# Patient Record
Sex: Female | Born: 1980 | Race: White | Hispanic: No | Marital: Married | Smoking: Never smoker
Health system: Southern US, Community
[De-identification: ages and names within clinical notes are randomized; demographics above are authoritative.]

## PROBLEM LIST (undated history)

## (undated) DIAGNOSIS — B019 Varicella without complication: Secondary | ICD-10-CM

## (undated) DIAGNOSIS — J189 Pneumonia, unspecified organism: Secondary | ICD-10-CM

## (undated) DIAGNOSIS — F909 Attention-deficit hyperactivity disorder, unspecified type: Secondary | ICD-10-CM

## (undated) DIAGNOSIS — U071 COVID-19: Secondary | ICD-10-CM

## (undated) DIAGNOSIS — F419 Anxiety disorder, unspecified: Secondary | ICD-10-CM

## (undated) HISTORY — PX: WISDOM TOOTH EXTRACTION: SHX21

## (undated) HISTORY — DX: Anxiety disorder, unspecified: F41.9

## (undated) HISTORY — DX: Varicella without complication: B01.9

---

## 2014-05-31 ENCOUNTER — Ambulatory Visit: Payer: BC Managed Care – PPO | Attending: Obstetrics and Gynecology | Admitting: Physical Therapy

## 2014-05-31 DIAGNOSIS — IMO0002 Reserved for concepts with insufficient information to code with codable children: Secondary | ICD-10-CM | POA: Diagnosis not present

## 2014-05-31 DIAGNOSIS — N942 Vaginismus: Secondary | ICD-10-CM | POA: Diagnosis not present

## 2014-05-31 DIAGNOSIS — IMO0001 Reserved for inherently not codable concepts without codable children: Secondary | ICD-10-CM | POA: Insufficient documentation

## 2014-06-15 ENCOUNTER — Ambulatory Visit: Payer: BC Managed Care – PPO | Admitting: Physical Therapy

## 2014-06-20 ENCOUNTER — Ambulatory Visit: Payer: BC Managed Care – PPO | Attending: Obstetrics and Gynecology | Admitting: Physical Therapy

## 2014-06-20 DIAGNOSIS — N942 Vaginismus: Secondary | ICD-10-CM | POA: Diagnosis not present

## 2014-06-20 DIAGNOSIS — IMO0001 Reserved for inherently not codable concepts without codable children: Secondary | ICD-10-CM | POA: Diagnosis not present

## 2014-06-20 DIAGNOSIS — IMO0002 Reserved for concepts with insufficient information to code with codable children: Secondary | ICD-10-CM | POA: Insufficient documentation

## 2014-07-03 ENCOUNTER — Ambulatory Visit: Payer: BC Managed Care – PPO | Admitting: Physical Therapy

## 2014-07-03 DIAGNOSIS — IMO0001 Reserved for inherently not codable concepts without codable children: Secondary | ICD-10-CM | POA: Diagnosis not present

## 2014-09-17 ENCOUNTER — Other Ambulatory Visit: Payer: Self-pay | Admitting: Family Medicine

## 2014-09-17 ENCOUNTER — Ambulatory Visit
Admission: RE | Admit: 2014-09-17 | Discharge: 2014-09-17 | Disposition: A | Discharge status: Home / Self Care | Payer: BC Managed Care – PPO | Source: Ambulatory Visit | Attending: Family Medicine | Admitting: Family Medicine

## 2014-09-17 DIAGNOSIS — M545 Low back pain: Secondary | ICD-10-CM

## 2014-10-29 ENCOUNTER — Ambulatory Visit: Payer: BC Managed Care – PPO

## 2014-11-05 ENCOUNTER — Ambulatory Visit: Payer: BC Managed Care – PPO

## 2014-11-20 ENCOUNTER — Ambulatory Visit: Payer: BC Managed Care – PPO | Attending: Family Medicine

## 2014-11-20 DIAGNOSIS — M545 Low back pain: Secondary | ICD-10-CM | POA: Diagnosis not present

## 2014-11-20 DIAGNOSIS — R5381 Other malaise: Secondary | ICD-10-CM | POA: Diagnosis not present

## 2014-11-29 ENCOUNTER — Ambulatory Visit: Payer: BC Managed Care – PPO

## 2014-11-29 DIAGNOSIS — M545 Low back pain: Secondary | ICD-10-CM | POA: Diagnosis not present

## 2014-12-13 ENCOUNTER — Ambulatory Visit: Payer: BC Managed Care – PPO

## 2014-12-13 DIAGNOSIS — M545 Low back pain: Secondary | ICD-10-CM | POA: Diagnosis not present

## 2014-12-20 ENCOUNTER — Ambulatory Visit: Payer: BC Managed Care – PPO | Attending: Family Medicine

## 2014-12-20 DIAGNOSIS — R5381 Other malaise: Secondary | ICD-10-CM | POA: Insufficient documentation

## 2014-12-20 DIAGNOSIS — M545 Low back pain: Secondary | ICD-10-CM | POA: Diagnosis present

## 2014-12-27 ENCOUNTER — Ambulatory Visit: Payer: BC Managed Care – PPO

## 2014-12-27 DIAGNOSIS — M545 Low back pain: Secondary | ICD-10-CM | POA: Diagnosis not present

## 2015-07-05 ENCOUNTER — Encounter: Payer: Self-pay | Admitting: Primary Care

## 2015-07-05 ENCOUNTER — Ambulatory Visit (INDEPENDENT_AMBULATORY_CARE_PROVIDER_SITE_OTHER): Payer: BC Managed Care – PPO | Admitting: Primary Care

## 2015-07-05 VITALS — BP 110/68 | HR 98 | Temp 98.1°F | Ht 70.0 in | Wt 173.0 lb

## 2015-07-05 DIAGNOSIS — F411 Generalized anxiety disorder: Secondary | ICD-10-CM | POA: Diagnosis not present

## 2015-07-05 DIAGNOSIS — R21 Rash and other nonspecific skin eruption: Secondary | ICD-10-CM

## 2015-07-05 DIAGNOSIS — R519 Headache, unspecified: Secondary | ICD-10-CM | POA: Insufficient documentation

## 2015-07-05 DIAGNOSIS — R51 Headache: Secondary | ICD-10-CM

## 2015-07-05 DIAGNOSIS — F32A Depression, unspecified: Secondary | ICD-10-CM | POA: Insufficient documentation

## 2015-07-05 MED ORDER — DESONIDE 0.05 % EX OINT
1.0000 | TOPICAL_OINTMENT | Freq: Two times a day (BID) | CUTANEOUS | Status: DC
Start: 2015-07-05 — End: 2015-11-14

## 2015-07-05 NOTE — Progress Notes (Signed)
Subjective:    Patient ID: Kathy Sanders, female    DOB: 04-29-1981, 34 y.o.   MRN: [id number]  HPI  Ms. Kathy Sanders is a 34 year old female who presents today to establish care and discuss the problems mentioned below. Will review old records.  1) Rash: Present to chin for the past 2 months. It's been waxing and waning during the summer. Denies itching and drainage, reports tenderness to the bumps that will pop up. She's tried applying an OTC antifungal cream without relief. She uses face wash daily, no new changes to face wash, body wash or detergents.   2) Generalized Anxiety Disorder: Present for years and has been on medication for 15 years. She was just switched from Lexapro to Wellbutrin XL 150mg  and feels improved. She is managed by a therapist once every 6 months but would like to transition to our clinic for management. Denies symptoms of depression, SI/HI.  3) Frequent headaches: Present every day to every other day for the past year and will take Excedrin and Advil. Denies migraines. Headaches are tolerable at this point.  Review of Systems  Constitutional: Negative for unexpected weight change.  HENT: Negative for rhinorrhea.   Respiratory: Negative for cough and shortness of breath.   Cardiovascular: Negative for chest pain.  Gastrointestinal: Negative for diarrhea and constipation.  Genitourinary: Negative for difficulty urinating.       She does not have regular periods, is managed by GYN.  Musculoskeletal: Negative for myalgias and arthralgias.  Skin: Positive for rash.  Neurological: Positive for headaches. Negative for numbness.       Occasional dizziness with postural changes.  Psychiatric/Behavioral:       See HPI       Past Medical History  Diagnosis Date  . Chicken pox     Social History   Social History  . Marital Status: Married    Spouse Name: N/A  . Number of Children: N/A  . Years of Education: N/A   Occupational History  . Not on file.    Social History Main Topics  . Smoking status: Never Smoker   . Smokeless tobacco: Not on file  . Alcohol Use: 0.0 oz/week    0 Standard drinks or equivalent per week     Comment: rare  . Drug Use: Not on file  . Sexual Activity: Not on file   Other Topics Concern  . Not on file   Social History Narrative  . No narrative on file    History reviewed. No pertinent past surgical history.  Family History  Problem Relation Age of Onset  . Mental retardation Mother   . Alcohol abuse Maternal Uncle   . Cancer Maternal Uncle     prostate  . Cancer Maternal Grandmother     breast  . Asthma Paternal Grandmother     Allergies  Allergen Reactions  . Amoxicillin     No current outpatient prescriptions on file prior to visit.   No current facility-administered medications on file prior to visit.    BP 110/68 mmHg  Pulse 98  Temp(Src) 98.1 F (36.7 C) (Oral)  Ht 5\' 10"  (1.778 m)  Wt 173 lb (78.472 kg)  BMI 24.82 kg/m2  SpO2 99%    Objective:   Physical Exam  Constitutional: She is oriented to person, place, and time. She appears well-nourished.  Cardiovascular: Normal rate and regular rhythm.   Pulmonary/Chest: Effort normal and breath sounds normal.  Neurological: She is alert and oriented to person,  place, and time.  Skin: Skin is warm and dry. Rash noted.  Mild 1/2 cm rash noted to chin representing eczema.  Psychiatric: She has a normal mood and affect.          Assessment & Plan:  Rash:  Present to chin intermittently x 2 months. No improvement with antifungal cream and daily face washing. Rash is 1/2 cm and appears to be atopic dermatitis. Will treat with Desonide 0.05% ointment BID. Follow up PRN.

## 2015-07-05 NOTE — Assessment & Plan Note (Signed)
Long standing history, taking medication for 15 years. Changed from Lexapro to Wellbutrin 1 year ago and feeling well managed. Denies symptoms of depression, Si/HI. Continue Wellbutrin although I suspect this is the cause of her headaches. Will continue to monitor.

## 2015-07-05 NOTE — Assessment & Plan Note (Signed)
Present x 1 year since starting Wellbutrin XL for GAD. Headaches are tolerable as she will take advil or excedrin PRN. Denies migraines. Will continue to monitor.

## 2015-07-05 NOTE — Patient Instructions (Signed)
Try applying the ointment twice daily for 3-4 weeks.  Please schedule a physical with me in the next 3-6 months. You will also schedule a lab only appointment one week prior. We will discuss your lab results during your physical.  It was a pleasure to meet you today! Please don't hesitate to call me with any questions. Welcome to [facility]!

## 2015-07-05 NOTE — Progress Notes (Signed)
Pre visit review using our clinic review tool, if applicable. No additional management support is needed unless otherwise documented below in the visit note. 

## 2015-09-03 ENCOUNTER — Ambulatory Visit (INDEPENDENT_AMBULATORY_CARE_PROVIDER_SITE_OTHER): Payer: BC Managed Care – PPO | Admitting: Primary Care

## 2015-09-03 ENCOUNTER — Encounter: Payer: Self-pay | Admitting: Primary Care

## 2015-09-03 VITALS — BP 116/72 | HR 86 | Temp 98.1°F | Ht 68.0 in | Wt 173.8 lb

## 2015-09-03 DIAGNOSIS — R438 Other disturbances of smell and taste: Secondary | ICD-10-CM

## 2015-09-03 LAB — CBC WITH DIFFERENTIAL/PLATELET
BASOS PCT: 0.6 % (ref 0.0–3.0)
Basophils Absolute: 0 10*3/uL (ref 0.0–0.1)
EOS PCT: 1.2 % (ref 0.0–5.0)
Eosinophils Absolute: 0.1 10*3/uL (ref 0.0–0.7)
HEMATOCRIT: 41.2 % (ref 36.0–46.0)
HEMOGLOBIN: 13.7 g/dL (ref 12.0–15.0)
LYMPHS PCT: 55.4 % — AB (ref 12.0–46.0)
Lymphs Abs: 3.7 10*3/uL (ref 0.7–4.0)
MCHC: 33.2 g/dL (ref 30.0–36.0)
MCV: 90.2 fl (ref 78.0–100.0)
MONOS PCT: 8.1 % (ref 3.0–12.0)
Monocytes Absolute: 0.5 10*3/uL (ref 0.1–1.0)
Neutro Abs: 2.3 10*3/uL (ref 1.4–7.7)
Neutrophils Relative %: 34.7 % — ABNORMAL LOW (ref 43.0–77.0)
Platelets: 280 10*3/uL (ref 150.0–400.0)
RBC: 4.57 Mil/uL (ref 3.87–5.11)
RDW: 13.1 % (ref 11.5–15.5)
WBC: 6.6 10*3/uL (ref 4.0–10.5)

## 2015-09-03 LAB — COMPREHENSIVE METABOLIC PANEL
ALBUMIN: 3.9 g/dL (ref 3.5–5.2)
ALK PHOS: 46 U/L (ref 39–117)
ALT: 12 U/L (ref 0–35)
AST: 16 U/L (ref 0–37)
BUN: 13 mg/dL (ref 6–23)
CALCIUM: 9.4 mg/dL (ref 8.4–10.5)
CHLORIDE: 101 meq/L (ref 96–112)
CO2: 31 mEq/L (ref 19–32)
Creatinine, Ser: 0.79 mg/dL (ref 0.40–1.20)
GFR: 87.4 mL/min (ref >60.00)
Glucose, Bld: 92 mg/dL (ref 70–99)
POTASSIUM: 3.7 meq/L (ref 3.5–5.1)
Sodium: 136 mEq/L (ref 135–145)
Total Bilirubin: 0.2 mg/dL (ref 0.2–1.2)
Total Protein: 7.2 g/dL (ref 6.0–8.3)

## 2015-09-03 NOTE — Progress Notes (Signed)
Subjective:    Patient ID: Kathy Sanders, female    DOB: 1981-02-19, 34 y.o.   MRN: [id number]  HPI  Kathy Sanders is a 34 year old female who presents today with a chief complaint of metallic taste in mouth. She first noticed this 3-4 months ago and is located only to the left side of her mouth. The taste occurs intermittently and sporatically with and without eating and drinking. She has a filtered water container that she's replaced recently. She's recently been evaluated at her dentist's office and was told her symptoms were not related to a dental problem. She will experience intermittent numbness/tinlging to the left side of her cheek. Denies recently taking antibiotics, taking any new medications, changing foods, fevers, recent illness. She has not had her water tested. Her husband does not have these symptoms.  Review of Systems  Constitutional: Negative for fever.  HENT: Negative for ear pain and rhinorrhea.   Respiratory: Negative for cough and shortness of breath.   Cardiovascular: Negative for chest pain.       Past Medical History  Diagnosis Date  . Chicken pox     Social History   Social History  . Marital Status: Married    Spouse Name: N/A  . Number of Children: N/A  . Years of Education: N/A   Occupational History  . Not on file.   Social History Main Topics  . Smoking status: Never Smoker   . Smokeless tobacco: Not on file  . Alcohol Use: 0.0 oz/week    0 Standard drinks or equivalent per week     Comment: rare  . Drug Use: Not on file  . Sexual Activity: Not on file   Other Topics Concern  . Not on file   Social History Narrative    No past surgical history on file.  Family History  Problem Relation Age of Onset  . Mental retardation Mother   . Alcohol abuse Maternal Uncle   . Cancer Maternal Uncle     prostate  . Cancer Maternal Grandmother     breast  . Asthma Paternal Grandmother     Allergies  Allergen Reactions  . Amoxicillin       Current Outpatient Prescriptions on File Prior to Visit  Medication Sig Dispense Refill  . buPROPion (WELLBUTRIN XL) 150 MG 24 hr tablet Take 150 mg by mouth daily.     Marland Kitchen desonide (DESOWEN) 0.05 % ointment Apply 1 application topically 2 (two) times daily. 15 g 0  . VIORELE 0.15-0.02/0.01 MG (21/5) tablet Take 1 tablet by mouth daily.      No current facility-administered medications on file prior to visit.    BP 116/72 mmHg  Pulse 86  Temp(Src) 98.1 F (36.7 C) (Oral)  Ht 5\' 8"  (1.727 m)  Wt 173 lb 12.8 oz (78.835 kg)  BMI 26.43 kg/m2  SpO2 98%    Objective:   Physical Exam  Constitutional: She is oriented to person, place, and time. She appears well-nourished.  HENT:  No obvious ulcer, infection, or abnormality to oral cavity. Teeth intact.  Neck: Neck supple.  Cardiovascular: Normal rate and regular rhythm.   Pulmonary/Chest: Effort normal and breath sounds normal.  Neurological: She is alert and oriented to person, place, and time. No cranial nerve deficit.  Skin: Skin is warm and dry.          Assessment & Plan:  Metallic taste:  Present intermittently for the past 3-4 months. Not affected by eating and drinking.  No recent illness or change in meds. She drinks filtered water and has not had her water tested. Exam unremarkable, however with some decreased sensation to left side of cheek. Cranial nerves otherwise normal. Will obtain CMP and CBC to rule out metabolic dysfunction. She is to have her water tested and follow up PRN.

## 2015-09-03 NOTE — Patient Instructions (Signed)
Complete lab work prior to leaving today. I will notify you of your results.  Have your water tested to ensure there are not metals in your water.  It was a pleasure to see you today!

## 2015-09-05 ENCOUNTER — Other Ambulatory Visit: Payer: Self-pay | Admitting: Primary Care

## 2015-09-05 DIAGNOSIS — R202 Paresthesia of skin: Principal | ICD-10-CM

## 2015-09-05 DIAGNOSIS — R200 Anesthesia of skin: Secondary | ICD-10-CM

## 2015-09-27 ENCOUNTER — Ambulatory Visit: Payer: Self-pay | Admitting: Neurology

## 2015-10-31 ENCOUNTER — Other Ambulatory Visit: Payer: Self-pay | Admitting: Primary Care

## 2015-10-31 DIAGNOSIS — Z0000 Encounter for general adult medical examination without abnormal findings: Secondary | ICD-10-CM

## 2015-11-07 ENCOUNTER — Other Ambulatory Visit: Payer: BC Managed Care – PPO

## 2015-11-07 ENCOUNTER — Encounter: Payer: BC Managed Care – PPO | Admitting: Primary Care

## 2015-11-08 ENCOUNTER — Other Ambulatory Visit (INDEPENDENT_AMBULATORY_CARE_PROVIDER_SITE_OTHER): Payer: BC Managed Care – PPO

## 2015-11-08 DIAGNOSIS — Z0000 Encounter for general adult medical examination without abnormal findings: Secondary | ICD-10-CM | POA: Diagnosis not present

## 2015-11-08 LAB — BASIC METABOLIC PANEL
BUN: 10 mg/dL (ref 6–23)
CALCIUM: 9.4 mg/dL (ref 8.4–10.5)
CO2: 27 mEq/L (ref 19–32)
CREATININE: 0.86 mg/dL (ref 0.40–1.20)
Chloride: 104 mEq/L (ref 96–112)
GFR: 79.17 mL/min (ref >60.00)
Glucose, Bld: 90 mg/dL (ref 70–99)
Potassium: 4.8 mEq/L (ref 3.5–5.1)
Sodium: 138 mEq/L (ref 135–145)

## 2015-11-08 LAB — LIPID PANEL
CHOLESTEROL: 162 mg/dL (ref 0–200)
HDL: 69.6 mg/dL (ref >39.00)
LDL Cholesterol: 81 mg/dL (ref 0–99)
NonHDL: 92.18
TRIGLYCERIDES: 56 mg/dL (ref 0.0–149.0)
Total CHOL/HDL Ratio: 2
VLDL: 11.2 mg/dL (ref 0.0–40.0)

## 2015-11-08 LAB — VITAMIN D 25 HYDROXY (VIT D DEFICIENCY, FRACTURES): VITD: 50.31 ng/mL (ref 30.00–100.00)

## 2015-11-08 LAB — TSH: TSH: 2.12 u[IU]/mL (ref 0.35–4.50)

## 2015-11-14 ENCOUNTER — Ambulatory Visit (INDEPENDENT_AMBULATORY_CARE_PROVIDER_SITE_OTHER): Payer: BC Managed Care – PPO | Admitting: Primary Care

## 2015-11-14 ENCOUNTER — Encounter: Payer: Self-pay | Admitting: Primary Care

## 2015-11-14 VITALS — BP 118/78 | HR 90 | Temp 98.6°F | Ht 70.0 in | Wt 174.2 lb

## 2015-11-14 DIAGNOSIS — Z0000 Encounter for general adult medical examination without abnormal findings: Secondary | ICD-10-CM | POA: Diagnosis not present

## 2015-11-14 DIAGNOSIS — R21 Rash and other nonspecific skin eruption: Secondary | ICD-10-CM | POA: Diagnosis not present

## 2015-11-14 DIAGNOSIS — Z0001 Encounter for general adult medical examination with abnormal findings: Secondary | ICD-10-CM | POA: Insufficient documentation

## 2015-11-14 DIAGNOSIS — F411 Generalized anxiety disorder: Secondary | ICD-10-CM

## 2015-11-14 DIAGNOSIS — R438 Other disturbances of smell and taste: Secondary | ICD-10-CM

## 2015-11-14 MED ORDER — TRIAMCINOLONE ACETONIDE 0.1 % EX CREA: 1.0000 "application " | TOPICAL_CREAM | Freq: Two times a day (BID) | CUTANEOUS | Status: DC

## 2015-11-14 NOTE — Assessment & Plan Note (Signed)
Tdap and flu UTD. Pap UTD.  Labs and exam unremarkable. Discussed the importance of a healthy diet and regular exercise in order for weight loss and to reduce risk of other medical diseases. Follow up in 1 year for repeat physical.

## 2015-11-14 NOTE — Progress Notes (Signed)
Pre visit review using our clinic review tool, if applicable. No additional management support is needed unless otherwise documented below in the visit note. 

## 2015-11-14 NOTE — Assessment & Plan Note (Signed)
Located to right upper chin x 6+ months. Mild improvement with desonide, overall not dissipating. Continues to wash her face regularly. Will try medium potency cream (triamcinolone), if no improvement then will send to derm.

## 2015-11-14 NOTE — Patient Instructions (Addendum)
Slowly wean of Wellbutrin XL tablets. Take 1 tablet by mouth every other day for 2 weeks, then 1 tablet every 3 days for 2 weeks, then stop.  Stop using the Desonide. Try applying the triamcinolone cream twice daily to affected area for 2 weeks. Use a small amount. If no improvement then call me as we will need to send you to a dermatologist for further evaluation.  You will be contacted regarding your referral to the allergist.  Please let us know if you have not heard back within one week.   Follow up in 1 year for repeat physical or sooner if needed.  It was a pleasure to see you today!

## 2015-11-14 NOTE — Progress Notes (Signed)
Subjective:    Patient ID: Kathy Sanders, female    DOB: May 15, 1981, 34 y.o.   MRN: [id number]  HPI  Kathy Sanders is a 34 year old female who presents today for complete physical.  Immunizations: -Tetanus: Completed in September 2014. -Influenza: Completed in November 2016.   Diet: Endorses a fair diet. She is a vegetarian.  Breakfast: Yogurt with granola Lunch: Vegetables with hummus, pretzles, Dinner: Pizza, pasta, tacos, veggie chicken nuggets. Snacks: None Desserts: 5-7 times a week Beverages: Water, coffee,occasional soda  Exercise: She is not currently exercising. Eye exam: Completed several years ago. No changes in vision. Dental exam: Completed recently.  Pap Smear: Due in January 2017, follows with GYN.    Review of Systems  Constitutional: Negative for unexpected weight change.  HENT: Negative for rhinorrhea.   Respiratory: Negative for cough and shortness of breath.   Cardiovascular: Negative for chest pain.  Gastrointestinal: Negative for diarrhea and constipation.  Genitourinary: Negative for difficulty urinating.       Currently on Viorele  Musculoskeletal: Negative for myalgias and arthralgias.  Neurological: Positive for headaches. Negative for dizziness and numbness.  Psychiatric/Behavioral:       Currently on Wellbutrin XL, feels improved and would like to wean off. Uses Alprazolam once every couple of months.       Past Medical History  Diagnosis Date  . Chicken pox     Social History   Social History  . Marital Status: Married    Spouse Name: N/A  . Number of Children: N/A  . Years of Education: N/A   Occupational History  . Not on file.   Social History Main Topics  . Smoking status: Never Smoker   . Smokeless tobacco: Not on file  . Alcohol Use: 0.0 oz/week    0 Standard drinks or equivalent per week     Comment: rare  . Drug Use: Not on file  . Sexual Activity: Not on file   Other Topics Concern  . Not on file   Social  History Narrative    No past surgical history on file.  Family History  Problem Relation Age of Onset  . Mental retardation Mother   . Alcohol abuse Maternal Uncle   . Cancer Maternal Uncle     prostate  . Cancer Maternal Grandmother     breast  . Asthma Paternal Grandmother     Allergies  Allergen Reactions  . Amoxicillin     Current Outpatient Prescriptions on File Prior to Visit  Medication Sig Dispense Refill  . buPROPion (WELLBUTRIN XL) 150 MG 24 hr tablet Take 150 mg by mouth daily.     Marland Kitchen VIORELE 0.15-0.02/0.01 MG (21/5) tablet Take 1 tablet by mouth daily.      No current facility-administered medications on file prior to visit.    BP 118/78 mmHg  Pulse 90  Temp(Src) 98.6 F (37 C) (Oral)  Ht 5\' 10"  (1.778 m)  Wt 174 lb 4 oz (79.039 kg)  BMI 25.00 kg/m2  SpO2 99%    Objective:   Physical Exam  Constitutional: She is oriented to person, place, and time. She appears well-nourished.  HENT:  Right Ear: Tympanic membrane and ear canal normal.  Left Ear: Tympanic membrane and ear canal normal.  Nose: Nose normal.  Mouth/Throat: Oropharynx is clear and moist.  Eyes: Conjunctivae are normal. Pupils are equal, round, and reactive to light.  Neck: Neck supple.  Cardiovascular: Normal rate and regular rhythm.   Pulmonary/Chest: Effort normal and  breath sounds normal.  Abdominal: Soft. Bowel sounds are normal. There is no tenderness.  Musculoskeletal: Normal range of motion.  Lymphadenopathy:    She has no cervical adenopathy.  Neurological: She is alert and oriented to person, place, and time. She has normal reflexes. No cranial nerve deficit.  Skin:  Small skin erruption still present to right upper chin. Intact. Mild erythema.  Psychiatric: She has a normal mood and affect.          Assessment & Plan:

## 2015-11-14 NOTE — Assessment & Plan Note (Signed)
Feeling improved overall and would like to wean off Wellbutrin. Will slowly wean over 1 month. She would like to continue to have PRN alprazolam 1 mg that she uses once every 2-4 months. Will have her sign contract and complete UDS next visit.

## 2015-11-15 ENCOUNTER — Encounter: Payer: BC Managed Care – PPO | Admitting: Primary Care

## 2015-11-26 ENCOUNTER — Telehealth: Payer: Self-pay | Admitting: Internal Medicine

## 2015-11-26 NOTE — Telephone Encounter (Signed)
I don't have the tools for this assessment. Suggest she go to one of the other allergy offices such as Dr Br[facility].

## 2015-11-26 NOTE — Telephone Encounter (Signed)
Per 11/14/15 REFERRAL: 35 year old female with metallic taste in mouth for year. Evaluated by dentist, does have root canal to left upper side, could be allergic. Please test/evaluate. ---  Please advise Dr. Annamaria Boots thanks

## 2015-11-26 NOTE — Telephone Encounter (Signed)
Called [facility] and LMTCB x1

## 2015-11-27 NOTE — Telephone Encounter (Signed)
Spoke with Rosaria Ferries. Notified her of CY's response. Nothing further was needed.

## 2015-12-18 ENCOUNTER — Other Ambulatory Visit: Payer: Self-pay | Admitting: Primary Care

## 2015-12-18 ENCOUNTER — Encounter: Payer: Self-pay | Admitting: Primary Care

## 2015-12-18 DIAGNOSIS — F411 Generalized anxiety disorder: Secondary | ICD-10-CM

## 2015-12-18 MED ORDER — BUPROPION HCL ER (XL) 150 MG PO TB24: 150.0000 mg | ORAL_TABLET | Freq: Every day | ORAL | Status: DC

## 2016-01-15 ENCOUNTER — Telehealth: Payer: Self-pay | Admitting: Primary Care

## 2016-01-15 NOTE — Telephone Encounter (Signed)
Will you check on Kathy Sanders? How's she doing since we restarted her on her Wellbutrin?

## 2016-01-15 NOTE — Telephone Encounter (Signed)
Left detail message for patient to call back.

## 2016-01-20 ENCOUNTER — Encounter: Payer: Self-pay | Admitting: *Deleted

## 2016-01-20 NOTE — Telephone Encounter (Signed)
Message left for patient to return my call.  Also send patient a MyChart message.

## 2016-02-03 ENCOUNTER — Encounter: Payer: Self-pay | Admitting: Primary Care

## 2016-02-03 ENCOUNTER — Other Ambulatory Visit: Payer: Self-pay | Admitting: Primary Care

## 2016-02-03 DIAGNOSIS — R21 Rash and other nonspecific skin eruption: Secondary | ICD-10-CM

## 2016-05-21 ENCOUNTER — Other Ambulatory Visit: Payer: Self-pay | Admitting: Primary Care

## 2016-05-21 ENCOUNTER — Encounter: Payer: Self-pay | Admitting: Primary Care

## 2016-05-21 DIAGNOSIS — R438 Other disturbances of smell and taste: Secondary | ICD-10-CM

## 2016-05-21 DIAGNOSIS — F411 Generalized anxiety disorder: Secondary | ICD-10-CM

## 2016-05-21 MED ORDER — BUPROPION HCL ER (XL) 150 MG PO TB24: 150.0000 mg | ORAL_TABLET | Freq: Every day | ORAL | Status: DC

## 2016-06-24 ENCOUNTER — Other Ambulatory Visit: Payer: Self-pay | Admitting: Otolaryngology

## 2016-06-24 DIAGNOSIS — R432 Parageusia: Secondary | ICD-10-CM

## 2016-07-07 ENCOUNTER — Ambulatory Visit: Payer: BC Managed Care – PPO

## 2016-10-07 ENCOUNTER — Other Ambulatory Visit: Payer: Self-pay | Admitting: Primary Care

## 2016-10-07 DIAGNOSIS — F411 Generalized anxiety disorder: Secondary | ICD-10-CM

## 2016-10-29 ENCOUNTER — Encounter: Payer: Self-pay | Admitting: Primary Care

## 2016-11-06 ENCOUNTER — Encounter: Payer: Self-pay | Admitting: Primary Care

## 2016-11-06 ENCOUNTER — Ambulatory Visit (INDEPENDENT_AMBULATORY_CARE_PROVIDER_SITE_OTHER): Payer: BC Managed Care – PPO | Admitting: Primary Care

## 2016-11-06 VITALS — BP 122/74 | HR 94 | Temp 98.3°F | Ht 70.0 in | Wt 171.1 lb

## 2016-11-06 DIAGNOSIS — Z23 Encounter for immunization: Secondary | ICD-10-CM | POA: Diagnosis not present

## 2016-11-06 DIAGNOSIS — F411 Generalized anxiety disorder: Secondary | ICD-10-CM | POA: Diagnosis not present

## 2016-11-06 DIAGNOSIS — R21 Rash and other nonspecific skin eruption: Secondary | ICD-10-CM

## 2016-11-06 NOTE — Progress Notes (Signed)
Pre visit review using our clinic review tool, if applicable. No additional management support is needed unless otherwise documented below in the visit note. 

## 2016-11-06 NOTE — Assessment & Plan Note (Signed)
Superficial bumps to hands x 1 week. History of this before. Suspect response to recent pregnancy, perhaps correlation to "stye" of eyes.  Discussed to try Claritin. If no improvement in 1 month, then will consider dermatology referral.

## 2016-11-06 NOTE — Assessment & Plan Note (Signed)
Weaning off of Wellbutrin given recent pregnancy. Overall doing well.

## 2016-11-06 NOTE — Patient Instructions (Signed)
The bumps on your hands are likely a stress response from the body given your recent pregnancy. Please notify me if this continues to be an issue in 1 month.  Apply warm compresses to your styes if they return.   Follow up with your GYN as scheduled.  It was a pleasure to see you today! Congratulations!

## 2016-11-06 NOTE — Progress Notes (Signed)
Subjective:    Patient ID: Kathy Sanders, female    DOB: 17-Mar-1981, 35 y.o.   MRN: [id number]  HPI  Kathy Sanders is a 35 year old female who presents today with multiple complaints.  1) Positive Pregnancy Test: LMP November 14th. She took a pregnancy Wednesday last week which was positive. She is weaning off of the Wellbutrin now. She has an appointment scheduled with her GYN on January 5th. She denies vaginal bleeding.  2) Finger Bumps: Located to the bilateral fingers that has been present for the past 1 week. She denies changes in soaps, detergents, food, medication. She does have a history of these bumps in the past which only lasted for several days. She does experience soreness, denies itching. She's not applied anything OTC.  3) Stye: Located to bilateral eyes (alternating) for the past 1 month. She's been applying neosporin without much improvement. She has recently experienced discomfort. She denies eye pain, itching, drainage, fevers.  Review of Systems  Constitutional: Negative for fever.  Eyes: Negative for pain, discharge, redness and itching.       Intermittent stye  Genitourinary: Negative for vaginal bleeding.  Skin: Negative for rash.       Superficial bumps       Past Medical History:  Diagnosis Date  . Chicken pox      Social History   Social History  . Marital status: Married    Spouse name: N/A  . Number of children: N/A  . Years of education: N/A   Occupational History  . Not on file.   Social History Main Topics  . Smoking status: Never Smoker  . Smokeless tobacco: Not on file  . Alcohol use 0.0 oz/week     Comment: rare  . Drug use: Unknown  . Sexual activity: Not on file   Other Topics Concern  . Not on file   Social History Narrative  . No narrative on file    No past surgical history on file.  Family History  Problem Relation Age of Onset  . Mental retardation Mother   . Alcohol abuse Maternal Uncle   . Cancer Maternal  Uncle     prostate  . Cancer Maternal Grandmother     breast  . Asthma Paternal Grandmother     Allergies  Allergen Reactions  . Amoxicillin     Current Outpatient Prescriptions on File Prior to Visit  Medication Sig Dispense Refill  . buPROPion (WELLBUTRIN XL) 150 MG 24 hr tablet TAKE 1 TABLET (150 MG TOTAL) BY MOUTH DAILY. 30 tablet 0   No current facility-administered medications on file prior to visit.     BP 122/74   Pulse 94   Temp 98.3 F (36.8 C) (Oral)   Ht 5\' 10"  (1.778 m)   Wt 171 lb 1.9 oz (77.6 kg)   SpO2 99%   BMI 24.55 kg/m    Objective:   Physical Exam  Constitutional: She appears well-nourished.  Eyes: Right eye exhibits no discharge and no hordeolum. Left eye exhibits no discharge and no hordeolum. Right conjunctiva is not injected. Left conjunctiva is not injected.  No evidence of active stye to either eyes.  Cardiovascular: Normal rate and regular rhythm.   Pulmonary/Chest: Effort normal.  Abdominal: Soft. Bowel sounds are normal. There is no tenderness.  Skin: Skin is warm and dry.  Small, superficial, raised, flesh colored bumps to bilateral palmar surface of fingers. No s/s of acute infection or insect bites. Non tender.  Assessment & Plan:  Positive Pregnancy Test:  LMP November 14th. No spotting or vaginal bleeding. OB/GYN appointment scheduled for January 5th. She is already on prenatal vitamins and is weaning off Wellbutrin appropriately. Offered HCG quantitative testing, she will obtain via GYN. Discussed to refrain from any medications, including OTC unless cleared by myself or GYN.  Kathy Flow, NP

## 2016-11-25 LAB — OB RESULTS CONSOLE RUBELLA ANTIBODY, IGM: RUBELLA: IMMUNE

## 2016-11-25 LAB — OB RESULTS CONSOLE VARICELLA ZOSTER ANTIBODY, IGG: Varicella: IMMUNE

## 2016-11-25 LAB — OB RESULTS CONSOLE HIV ANTIBODY (ROUTINE TESTING): HIV: NONREACTIVE

## 2016-11-25 LAB — OB RESULTS CONSOLE RPR: RPR: NONREACTIVE

## 2016-11-25 LAB — OB RESULTS CONSOLE GC/CHLAMYDIA
CHLAMYDIA, DNA PROBE: NEGATIVE
Gonorrhea: NEGATIVE

## 2016-11-25 LAB — OB RESULTS CONSOLE HEPATITIS B SURFACE ANTIGEN: Hepatitis B Surface Ag: NEGATIVE

## 2016-12-21 ENCOUNTER — Ambulatory Visit (INDEPENDENT_AMBULATORY_CARE_PROVIDER_SITE_OTHER): Payer: BC Managed Care – PPO | Admitting: Primary Care

## 2016-12-21 ENCOUNTER — Encounter: Payer: Self-pay | Admitting: Primary Care

## 2016-12-21 VITALS — BP 122/76 | HR 87 | Temp 98.6°F | Ht 70.0 in | Wt 177.8 lb

## 2016-12-21 DIAGNOSIS — H00014 Hordeolum externum left upper eyelid: Secondary | ICD-10-CM | POA: Diagnosis not present

## 2016-12-21 NOTE — Patient Instructions (Signed)
The stye should heal within 1 week, sometimes it takes several weeks.  Apply a hot/warm compress to the eye three times daily for 20 minute intervals.   Please call me if you develop increased swelling to the top and bottom of the eye, drainage, fevers, changes in vision.  It was a pleasure to see you today!

## 2016-12-21 NOTE — Progress Notes (Signed)
Pre visit review using our clinic review tool, if applicable. No additional management support is needed unless otherwise documented below in the visit note. 

## 2016-12-21 NOTE — Progress Notes (Signed)
   Subjective:    Patient ID: Kathy Sanders, female    DOB: [birth year], 36 y.o.   MRN: [id number]  HPI  Kathy Sanders is a 36 year old female who presents today with a chief complaint of eyelid swelling. She noticed a mass with swelling to her left upper eye lid that she noticed this morning. She felt soreness to the lid last night so she applied neosporin and applied a warm tea bag without improvement. Her eye is tender. She denies fevers, chills. She does have a history of styes in the past that have improved within 24 hours.   Review of Systems  Constitutional: Negative for fever.  HENT: Negative for congestion.   Eyes: Negative for redness and visual disturbance.       Swelling with erythema to left upper eye lid  Respiratory: Negative for cough.        Past Medical History:  Diagnosis Date  . Chicken pox      Social History   Social History  . Marital status: Married    Spouse name: N/A  . Number of children: N/A  . Years of education: N/A   Occupational History  . Not on file.   Social History Main Topics  . Smoking status: Never Smoker  . Smokeless tobacco: Never Used  . Alcohol use 0.0 oz/week     Comment: rare  . Drug use: Unknown  . Sexual activity: Not on file   Other Topics Concern  . Not on file   Social History Narrative  . No narrative on file    No past surgical history on file.  Family History  Problem Relation Age of Onset  . Mental retardation Mother   . Alcohol abuse Maternal Uncle   . Cancer Maternal Uncle     prostate  . Cancer Maternal Grandmother     breast  . Asthma Paternal Grandmother     Allergies  Allergen Reactions  . Amoxicillin     No current outpatient prescriptions on file prior to visit.   No current facility-administered medications on file prior to visit.     BP 122/76   Pulse 87   Temp 98.6 F (37 C) (Oral)   Ht 5\' 10"  (1.778 m)   Wt 177 lb 12.8 oz (80.6 kg)   SpO2 99%   BMI 25.51 kg/m     Objective:   Physical Exam  Constitutional: She appears well-nourished. She does not appear ill.  HENT:  Mouth/Throat: No posterior oropharyngeal erythema.  Eyes: Conjunctivae are normal.  Localized minor erythema with swelling to  left upper mid eye-lid. Tender. No drainage. No open lesion.  Neck: Neck supple.  Cardiovascular: Normal rate and regular rhythm.   Pulmonary/Chest: Breath sounds normal. No respiratory distress.          Assessment & Plan:  Stye:  Swelling, erythema to left upper lid x 24 hours. Little improvement with warm tea bag compress. Exam today representative of benign stye with mild erythema and swelling. Will have her apply warm compresses three times daily for 20 minute intervals. Discussed to call for increased swelling, pain, changes in vision, drainage. She verbalized understanding. Consider oral antibiotics if those symptoms occur.  Sheral Flow, NP

## 2017-06-30 ENCOUNTER — Inpatient Hospital Stay (HOSPITAL_COMMUNITY)
Admission: AD | Admit: 2017-06-30 | Discharge: 2017-06-30 | Disposition: A | Discharge status: Home / Self Care | Payer: BC Managed Care – PPO | Source: Ambulatory Visit | Attending: Obstetrics and Gynecology | Admitting: Obstetrics and Gynecology

## 2017-06-30 ENCOUNTER — Encounter (HOSPITAL_COMMUNITY): Payer: Self-pay | Admitting: *Deleted

## 2017-06-30 ENCOUNTER — Encounter (HOSPITAL_COMMUNITY): Payer: Self-pay

## 2017-06-30 ENCOUNTER — Inpatient Hospital Stay (HOSPITAL_COMMUNITY)
Admission: AD | Admit: 2017-06-30 | Discharge: 2017-07-04 | DRG: 766 | Disposition: A | Discharge status: Home / Self Care | Payer: BC Managed Care – PPO | Source: Ambulatory Visit | Attending: Obstetrics and Gynecology | Admitting: Obstetrics and Gynecology

## 2017-06-30 DIAGNOSIS — Z3A39 39 weeks gestation of pregnancy: Secondary | ICD-10-CM

## 2017-06-30 DIAGNOSIS — O26893 Other specified pregnancy related conditions, third trimester: Secondary | ICD-10-CM | POA: Diagnosis present

## 2017-06-30 DIAGNOSIS — O324XX0 Maternal care for high head at term, not applicable or unspecified: Principal | ICD-10-CM | POA: Diagnosis present

## 2017-06-30 DIAGNOSIS — O479 False labor, unspecified: Secondary | ICD-10-CM

## 2017-06-30 DIAGNOSIS — Z6791 Unspecified blood type, Rh negative: Secondary | ICD-10-CM

## 2017-06-30 NOTE — Progress Notes (Addendum)
G1 @ [redacted] [facility]ks[facility]. Presents to triage for r/o labor. Denies LOF or bleeding. + FM. EFM applied. VS  SVE: closed  1745: Provider notified. Report status of pt given. Orders received to discharge pt. Awaiting orders.

## 2017-06-30 NOTE — MAU Note (Signed)
Urine in lab 

## 2017-07-01 ENCOUNTER — Encounter (HOSPITAL_COMMUNITY)
Admission: AD | Disposition: A | Discharge status: Home / Self Care | Payer: Self-pay | Source: Ambulatory Visit | Attending: Obstetrics and Gynecology

## 2017-07-01 ENCOUNTER — Encounter (HOSPITAL_COMMUNITY): Payer: Self-pay | Admitting: Anesthesiology

## 2017-07-01 ENCOUNTER — Inpatient Hospital Stay (HOSPITAL_COMMUNITY): Payer: BC Managed Care – PPO | Admitting: Anesthesiology

## 2017-07-01 DIAGNOSIS — Z3A39 39 weeks gestation of pregnancy: Secondary | ICD-10-CM | POA: Diagnosis not present

## 2017-07-01 DIAGNOSIS — O324XX0 Maternal care for high head at term, not applicable or unspecified: Secondary | ICD-10-CM | POA: Diagnosis present

## 2017-07-01 DIAGNOSIS — Z3493 Encounter for supervision of normal pregnancy, unspecified, third trimester: Secondary | ICD-10-CM | POA: Diagnosis present

## 2017-07-01 DIAGNOSIS — O26893 Other specified pregnancy related conditions, third trimester: Secondary | ICD-10-CM | POA: Diagnosis present

## 2017-07-01 DIAGNOSIS — Z6791 Unspecified blood type, Rh negative: Secondary | ICD-10-CM | POA: Diagnosis not present

## 2017-07-01 LAB — RPR: RPR Ser Ql: NONREACTIVE

## 2017-07-01 LAB — CBC
HEMATOCRIT: 34.9 % — AB (ref 36.0–46.0)
Hemoglobin: 11.6 g/dL — ABNORMAL LOW (ref 12.0–15.0)
MCH: 27.2 pg (ref 26.0–34.0)
MCHC: 33.2 g/dL (ref 30.0–36.0)
MCV: 81.9 fL (ref 78.0–100.0)
Platelets: 294 10*3/uL (ref 150–400)
RBC: 4.26 MIL/uL (ref 3.87–5.11)
RDW: 15.5 % (ref 11.5–15.5)
WBC: 9.4 10*3/uL (ref 4.0–10.5)

## 2017-07-01 LAB — TYPE AND SCREEN
ABO/RH(D): A NEG
ANTIBODY SCREEN: NEGATIVE

## 2017-07-01 LAB — ABO/RH: ABO/RH(D): A NEG

## 2017-07-01 LAB — OB RESULTS CONSOLE GBS: STREP GROUP B AG: NEGATIVE

## 2017-07-01 SURGERY — Surgical Case
Anesthesia: Epidural | Site: Abdomen | Wound class: Clean Contaminated

## 2017-07-01 MED ORDER — ONDANSETRON HCL 4 MG/2ML IJ SOLN
INTRAMUSCULAR | Status: AC
  Filled 2017-07-01: qty 2

## 2017-07-01 MED ORDER — SODIUM BICARBONATE 8.4 % IV SOLN
INTRAVENOUS | Status: AC
  Filled 2017-07-01: qty 50

## 2017-07-01 MED ORDER — COCONUT OIL OIL
1.0000 "application " | TOPICAL_OIL | Status: DC | PRN
  Filled 2017-07-01: qty 120

## 2017-07-01 MED ORDER — OXYTOCIN 40 UNITS IN LACTATED RINGERS INFUSION - SIMPLE MED
2.5000 [IU]/h | INTRAVENOUS | Status: AC
Start: 2017-07-01 — End: 2017-07-02

## 2017-07-01 MED ORDER — DIBUCAINE 1 % RE OINT: 1.0000 "application " | TOPICAL_OINTMENT | RECTAL | Status: DC | PRN

## 2017-07-01 MED ORDER — OXYTOCIN BOLUS FROM INFUSION: 500.0000 mL | Freq: Once | INTRAVENOUS | Status: DC

## 2017-07-01 MED ORDER — MORPHINE SULFATE (PF) 0.5 MG/ML IJ SOLN
INTRAMUSCULAR | Status: AC
  Filled 2017-07-01: qty 10

## 2017-07-01 MED ORDER — PHENYLEPHRINE 40 MCG/ML (10ML) SYRINGE FOR IV PUSH (FOR BLOOD PRESSURE SUPPORT)
80.0000 ug | PREFILLED_SYRINGE | INTRAVENOUS | Status: DC | PRN
  Filled 2017-07-01 (×2): qty 10

## 2017-07-01 MED ORDER — TERBUTALINE SULFATE 1 MG/ML IJ SOLN
0.2500 mg | Freq: Once | INTRAMUSCULAR | Status: DC | PRN
Start: 2017-07-01 — End: 2017-07-01

## 2017-07-01 MED ORDER — LIDOCAINE HCL (PF) 1 % IJ SOLN
INTRAMUSCULAR | Status: DC | PRN
  Administered 2017-07-01 (×2): 5 mL via EPIDURAL

## 2017-07-01 MED ORDER — MEPERIDINE HCL 25 MG/ML IJ SOLN: 6.2500 mg | INTRAMUSCULAR | Status: DC | PRN

## 2017-07-01 MED ORDER — ONDANSETRON HCL 4 MG/2ML IJ SOLN: 4.0000 mg | Freq: Once | INTRAMUSCULAR | Status: DC | PRN

## 2017-07-01 MED ORDER — OXYCODONE-ACETAMINOPHEN 5-325 MG PO TABS: 2.0000 | ORAL_TABLET | ORAL | Status: DC | PRN

## 2017-07-01 MED ORDER — PHENYLEPHRINE 40 MCG/ML (10ML) SYRINGE FOR IV PUSH (FOR BLOOD PRESSURE SUPPORT)
PREFILLED_SYRINGE | INTRAVENOUS | Status: DC | PRN
  Administered 2017-07-01: 80 ug via INTRAVENOUS

## 2017-07-01 MED ORDER — OXYTOCIN 10 UNIT/ML IJ SOLN
INTRAMUSCULAR | Status: DC | PRN
  Administered 2017-07-01: 40 [IU] via INTRAVENOUS

## 2017-07-01 MED ORDER — MORPHINE SULFATE-NACL 0.5-0.9 MG/ML-% IV SOSY
PREFILLED_SYRINGE | INTRAVENOUS | Status: DC | PRN
  Administered 2017-07-01 (×3): 1 mg via EPIDURAL

## 2017-07-01 MED ORDER — LIDOCAINE HCL (PF) 1 % IJ SOLN: 30.0000 mL | INTRAMUSCULAR | Status: DC | PRN

## 2017-07-01 MED ORDER — SENNOSIDES-DOCUSATE SODIUM 8.6-50 MG PO TABS
2.0000 | ORAL_TABLET | ORAL | Status: DC
  Administered 2017-07-02 – 2017-07-03 (×2): 2 via ORAL
  Filled 2017-07-01 (×2): qty 2

## 2017-07-01 MED ORDER — ONDANSETRON HCL 4 MG/2ML IJ SOLN: 4.0000 mg | Freq: Four times a day (QID) | INTRAMUSCULAR | Status: DC | PRN

## 2017-07-01 MED ORDER — LACTATED RINGERS IV SOLN: 500.0000 mL | INTRAVENOUS | Status: DC | PRN

## 2017-07-01 MED ORDER — OXYTOCIN 40 UNITS IN LACTATED RINGERS INFUSION - SIMPLE MED
1.0000 m[IU]/min | INTRAVENOUS | Status: DC
  Administered 2017-07-01: 1 m[IU]/min via INTRAVENOUS
  Administered 2017-07-01: 5 m[IU]/min via INTRAVENOUS
  Filled 2017-07-01: qty 1000

## 2017-07-01 MED ORDER — DIPHENHYDRAMINE HCL 25 MG PO CAPS: 25.0000 mg | ORAL_CAPSULE | Freq: Four times a day (QID) | ORAL | Status: DC | PRN

## 2017-07-01 MED ORDER — FENTANYL CITRATE (PF) 100 MCG/2ML IJ SOLN
INTRAMUSCULAR | Status: AC
  Administered 2017-07-01: 25 ug via INTRAVENOUS
  Filled 2017-07-01: qty 2

## 2017-07-01 MED ORDER — OXYCODONE-ACETAMINOPHEN 5-325 MG PO TABS: 1.0000 | ORAL_TABLET | ORAL | Status: DC | PRN

## 2017-07-01 MED ORDER — TETANUS-DIPHTH-ACELL PERTUSSIS 5-2.5-18.5 LF-MCG/0.5 IM SUSP: 0.5000 mL | Freq: Once | INTRAMUSCULAR | Status: DC

## 2017-07-01 MED ORDER — PHENYLEPHRINE 40 MCG/ML (10ML) SYRINGE FOR IV PUSH (FOR BLOOD PRESSURE SUPPORT)
80.0000 ug | PREFILLED_SYRINGE | INTRAVENOUS | Status: DC | PRN
  Administered 2017-07-01: 80 ug via INTRAVENOUS

## 2017-07-01 MED ORDER — OXYTOCIN 10 UNIT/ML IJ SOLN
INTRAMUSCULAR | Status: AC
  Filled 2017-07-01: qty 4

## 2017-07-01 MED ORDER — METOCLOPRAMIDE HCL 5 MG/ML IJ SOLN
INTRAMUSCULAR | Status: DC | PRN
  Administered 2017-07-01 (×2): 5 mg via INTRAVENOUS

## 2017-07-01 MED ORDER — LACTATED RINGERS IV SOLN: 500.0000 mL | Freq: Once | INTRAVENOUS | Status: DC

## 2017-07-01 MED ORDER — ACETAMINOPHEN 325 MG PO TABS: 650.0000 mg | ORAL_TABLET | ORAL | Status: DC | PRN

## 2017-07-01 MED ORDER — MENTHOL 3 MG MT LOZG: 1.0000 | LOZENGE | OROMUCOSAL | Status: DC | PRN

## 2017-07-01 MED ORDER — SODIUM BICARBONATE 8.4 % IV SOLN
INTRAVENOUS | Status: DC | PRN
  Administered 2017-07-01 (×2): 5 mL via EPIDURAL

## 2017-07-01 MED ORDER — SIMETHICONE 80 MG PO CHEW
80.0000 mg | CHEWABLE_TABLET | Freq: Three times a day (TID) | ORAL | Status: DC
  Administered 2017-07-02 – 2017-07-03 (×5): 80 mg via ORAL
  Filled 2017-07-01 (×5): qty 1

## 2017-07-01 MED ORDER — LACTATED RINGERS IV SOLN
INTRAVENOUS | Status: DC
  Administered 2017-07-01 (×2): via INTRAVENOUS

## 2017-07-01 MED ORDER — EPHEDRINE 5 MG/ML INJ: 10.0000 mg | INTRAVENOUS | Status: DC | PRN

## 2017-07-01 MED ORDER — LIDOCAINE-EPINEPHRINE (PF) 2 %-1:200000 IJ SOLN
INTRAMUSCULAR | Status: AC
  Filled 2017-07-01: qty 20

## 2017-07-01 MED ORDER — OXYCODONE HCL 5 MG PO TABS: 10.0000 mg | ORAL_TABLET | ORAL | Status: DC | PRN

## 2017-07-01 MED ORDER — FENTANYL CITRATE (PF) 100 MCG/2ML IJ SOLN
25.0000 ug | INTRAMUSCULAR | Status: DC | PRN
  Administered 2017-07-01: 25 ug via INTRAVENOUS
  Administered 2017-07-01: 50 ug via INTRAVENOUS
  Administered 2017-07-01: 25 ug via INTRAVENOUS
  Administered 2017-07-01: 50 ug via INTRAVENOUS

## 2017-07-01 MED ORDER — WITCH HAZEL-GLYCERIN EX PADS: 1.0000 "application " | MEDICATED_PAD | CUTANEOUS | Status: DC | PRN

## 2017-07-01 MED ORDER — METOCLOPRAMIDE HCL 5 MG/ML IJ SOLN
INTRAMUSCULAR | Status: AC
  Filled 2017-07-01: qty 2

## 2017-07-01 MED ORDER — PHENYLEPHRINE HCL 10 MG/ML IJ SOLN
INTRAMUSCULAR | Status: DC | PRN
  Administered 2017-07-01 (×6): 80 ug via INTRAVENOUS

## 2017-07-01 MED ORDER — PHENYLEPHRINE 40 MCG/ML (10ML) SYRINGE FOR IV PUSH (FOR BLOOD PRESSURE SUPPORT)
PREFILLED_SYRINGE | INTRAVENOUS | Status: AC
  Filled 2017-07-01: qty 10

## 2017-07-01 MED ORDER — OXYTOCIN 40 UNITS IN LACTATED RINGERS INFUSION - SIMPLE MED: 2.5000 [IU]/h | INTRAVENOUS | Status: DC

## 2017-07-01 MED ORDER — ONDANSETRON HCL 4 MG/2ML IJ SOLN
INTRAMUSCULAR | Status: DC | PRN
  Administered 2017-07-01: 4 mg via INTRAVENOUS

## 2017-07-01 MED ORDER — LACTATED RINGERS IV SOLN: INTRAVENOUS | Status: DC

## 2017-07-01 MED ORDER — OXYCODONE HCL 5 MG PO TABS
5.0000 mg | ORAL_TABLET | ORAL | Status: DC | PRN
  Administered 2017-07-03 – 2017-07-04 (×5): 5 mg via ORAL
  Filled 2017-07-01 (×5): qty 1

## 2017-07-01 MED ORDER — ACETAMINOPHEN 325 MG PO TABS
650.0000 mg | ORAL_TABLET | ORAL | Status: DC | PRN
  Administered 2017-07-01 – 2017-07-04 (×5): 650 mg via ORAL
  Filled 2017-07-01 (×5): qty 2

## 2017-07-01 MED ORDER — PRENATAL MULTIVITAMIN CH
1.0000 | ORAL_TABLET | Freq: Every day | ORAL | Status: DC
  Administered 2017-07-02 – 2017-07-04 (×3): 1 via ORAL
  Filled 2017-07-01 (×3): qty 1

## 2017-07-01 MED ORDER — ZOLPIDEM TARTRATE 5 MG PO TABS: 5.0000 mg | ORAL_TABLET | Freq: Every evening | ORAL | Status: DC | PRN

## 2017-07-01 MED ORDER — CEFAZOLIN SODIUM-DEXTROSE 2-4 GM/100ML-% IV SOLN
INTRAVENOUS | Status: AC
  Filled 2017-07-01: qty 100

## 2017-07-01 MED ORDER — DIPHENHYDRAMINE HCL 50 MG/ML IJ SOLN: 12.5000 mg | INTRAMUSCULAR | Status: DC | PRN

## 2017-07-01 MED ORDER — SIMETHICONE 80 MG PO CHEW
80.0000 mg | CHEWABLE_TABLET | ORAL | Status: DC | PRN
  Administered 2017-07-03: 80 mg via ORAL
  Filled 2017-07-01: qty 1

## 2017-07-01 MED ORDER — SOD CITRATE-CITRIC ACID 500-334 MG/5ML PO SOLN
30.0000 mL | ORAL | Status: DC | PRN
  Administered 2017-07-01: 30 mL via ORAL
  Filled 2017-07-01: qty 15

## 2017-07-01 MED ORDER — FLEET ENEMA 7-19 GM/118ML RE ENEM: 1.0000 | ENEMA | RECTAL | Status: DC | PRN

## 2017-07-01 MED ORDER — IBUPROFEN 600 MG PO TABS
600.0000 mg | ORAL_TABLET | Freq: Four times a day (QID) | ORAL | Status: DC
  Administered 2017-07-01 – 2017-07-04 (×11): 600 mg via ORAL
  Filled 2017-07-01 (×11): qty 1

## 2017-07-01 MED ORDER — SIMETHICONE 80 MG PO CHEW
80.0000 mg | CHEWABLE_TABLET | ORAL | Status: DC
  Administered 2017-07-02 – 2017-07-03 (×2): 80 mg via ORAL
  Filled 2017-07-01 (×2): qty 1

## 2017-07-01 MED ORDER — FENTANYL 2.5 MCG/ML BUPIVACAINE 1/10 % EPIDURAL INFUSION (WH - ANES)
14.0000 mL/h | INTRAMUSCULAR | Status: DC | PRN
  Administered 2017-07-01: 15 mL/h via EPIDURAL
  Filled 2017-07-01 (×2): qty 100

## 2017-07-01 MED ORDER — CEFAZOLIN SODIUM-DEXTROSE 2-3 GM-% IV SOLR
INTRAVENOUS | Status: DC | PRN
  Administered 2017-07-01: 2 g via INTRAVENOUS

## 2017-07-01 SURGICAL SUPPLY — 38 items
BENZOIN TINCTURE PRP APPL 2/3 (GAUZE/BANDAGES/DRESSINGS) ×3 IMPLANT
CHLORAPREP W/TINT 26ML (MISCELLANEOUS) ×3 IMPLANT
CLAMP CORD UMBIL (MISCELLANEOUS) IMPLANT
CLOSURE WOUND 1/2 X4 (GAUZE/BANDAGES/DRESSINGS) ×1
CLOTH BEACON ORANGE TIMEOUT ST (SAFETY) ×3 IMPLANT
DERMABOND ADVANCED (GAUZE/BANDAGES/DRESSINGS) ×2
DERMABOND ADVANCED .7 DNX12 (GAUZE/BANDAGES/DRESSINGS) ×1 IMPLANT
DRSG OPSITE POSTOP 4X10 (GAUZE/BANDAGES/DRESSINGS) ×3 IMPLANT
ELECT REM PT RETURN 9FT ADLT (ELECTROSURGICAL) ×3
ELECTRODE REM PT RTRN 9FT ADLT (ELECTROSURGICAL) ×1 IMPLANT
EXTRACTOR VACUUM KIWI (MISCELLANEOUS) IMPLANT
GLOVE BIO SURGEON STRL SZ 6.5 (GLOVE) ×2 IMPLANT
GLOVE BIO SURGEONS STRL SZ 6.5 (GLOVE) ×1
GLOVE BIOGEL PI IND STRL 6.5 (GLOVE) ×1 IMPLANT
GLOVE BIOGEL PI IND STRL 7.0 (GLOVE) ×2 IMPLANT
GLOVE BIOGEL PI INDICATOR 6.5 (GLOVE) ×2
GLOVE BIOGEL PI INDICATOR 7.0 (GLOVE) ×4
GOWN STRL REUS W/TWL LRG LVL3 (GOWN DISPOSABLE) ×6 IMPLANT
KIT ABG SYR 3ML LUER SLIP (SYRINGE) ×3 IMPLANT
NEEDLE HYPO 25X5/8 SAFETYGLIDE (NEEDLE) ×3 IMPLANT
NS IRRIG 1000ML POUR BTL (IV SOLUTION) ×3 IMPLANT
PACK C SECTION WH (CUSTOM PROCEDURE TRAY) ×3 IMPLANT
PAD OB MATERNITY 4.3X12.25 (PERSONAL CARE ITEMS) ×3 IMPLANT
PENCIL SMOKE EVAC W/HOLSTER (ELECTROSURGICAL) ×3 IMPLANT
RETRACTOR WND ALEXIS 25 LRG (MISCELLANEOUS) IMPLANT
RTRCTR WOUND ALEXIS 25CM LRG (MISCELLANEOUS)
STRIP CLOSURE SKIN 1/2X4 (GAUZE/BANDAGES/DRESSINGS) ×2 IMPLANT
SUT PLAIN 0 NONE (SUTURE) IMPLANT
SUT PLAIN 2 0 (SUTURE) ×2
SUT PLAIN ABS 2-0 CT1 27XMFL (SUTURE) ×1 IMPLANT
SUT VIC AB 0 CT1 27 (SUTURE) ×2
SUT VIC AB 0 CT1 27XBRD ANBCTR (SUTURE) ×1 IMPLANT
SUT VIC AB 0 CT1 36 (SUTURE) ×6 IMPLANT
SUT VIC AB 0 CTX 36 (SUTURE) ×6
SUT VIC AB 0 CTX36XBRD ANBCTRL (SUTURE) ×3 IMPLANT
SUT VIC AB 4-0 PS2 27 (SUTURE) ×3 IMPLANT
TOWEL OR 17X24 6PK STRL BLUE (TOWEL DISPOSABLE) ×3 IMPLANT
TRAY FOLEY BAG SILVER LF 14FR (SET/KITS/TRAYS/PACK) IMPLANT

## 2017-07-01 NOTE — Op Note (Signed)
PROCEDURE DATE: 07/01/17  PREOPERATIVE DIAGNOSIS: Intrauterine pregnancy at 39.2 wga, Indication: arrest of descent  POSTOPERATIVE DIAGNOSIS:The same  PROCEDURE: primary Low TransverseCesarean Section  SURGEON: Dr. Lucillie Garfinkel  INDICATIONS:This is a 36yo G1P0 at 27.2wga wga requiring cesarean section secondary to arrest of descent.  She presented in early labor, progressed to complete dilation. She pushed almost 4 hours and was re-evaluated constantly during pushing. Maternal pushing effort decreased and vertex arrested at +3 station. Patient given option of cesarean section of operative vaginal delivery.  Discussed to fetus risks with patient and partner including bruising, hematoma, fetal nerve injury, increased tearing, and ICH. The risks of cesarean section discussed with the patient included but were not limited to: bleeding which may require transfusion or reoperation; infection which may require antibiotics; injury to bowel, bladder, ureters or other surrounding organs; injury to the fetus; need for additional procedures including hysterectomy in the event of a life-threatening hemorrhage; placental abnormalities wth subsequent pregnancies, incisional problems, thromboembolic phenomenon and other postoperative/anesthesia complications. Patient opted for operative vaginal delivery. Thus, decision made to proceed with operative vaginal delivery for maternal exhaustion and arrest of descent. Verbally consented and agreed. Bladder was emptied. EFW 7#10. Anesthesia found to be adequate. Position confirmed to be LOA. Pelvis felt to be adequate. Forceps blades easily placed with position confirmed. Over one pull, vertex did not move. I recommended cesarean section. She consented to the above risks. Decision made to proceed with pLTCS. The patient agreed with the proposed plan, giving informed consent for the procedure.   FINDINGS: Viable maleinfant in vertex presentation,APGARs  pending, Weight pending, Amniotic fluid clear, Intact placenta, three vessel cord. Grossly normal uterus, ovaries and fallopian tubes. .  ANESTHESIA: Epidural ESTIMATED BLOOD LOSS: 968cc SPECIMENS: Placenta for path COMPLICATIONS: None immediate   PROCEDURE IN DETAIL: The patient received intravenous antibiotics (2g Ancef) and had sequential compression devices applied to her lower extremities while in the preoperative area. Shewasthen taken to the operating roomwhere epidural anesthesiawas dosed up to surgical level andwas found to be adequate. She was then placed in a dorsal supine position with a leftward tilt,and prepped and draped in a sterile manner.A foley catheter was placed into her bladder and attached to constant gravity. After an adequate timeout was performed, aPfannenstiel skin incision was made with scalpel and carried through to the underlying layer of fascia. The fascia was incised in the midline and this incision was extended bilaterally using the Mayo scissors. Kocher clamps were applied to the superior aspect of the fascial incision and the underlying rectus muscles were dissected off bluntly. A similar process was carried out on the inferior aspect of the facial incision. The rectus muscles were separated in the midline bluntly and the peritoneum was entered bluntly. A bladder flap was created sharply and developed bluntly.Atransverse hysterotomy was made with a scalpel and extended bilaterally bluntly. The bladder blade was then removed. The infant was successfully delivered, and cord was clamped and cut and infant was handed over to awaiting neonatology team. Uterine massage was then administered and the placenta delivered intact with three-vessel cord. Cord gases were taken. The uterus was cleared of clot and debris. The hysterotomy was closed with 0 vicryl.A second imbricating suture of 0-vicryl was used to reinforce the incision and aid in hemostasis.The  fascia was closed with 0-Vicryl in a running fashion with good restoration of anatomy. The subcutaneus tissue was irrigated and was reapproximated using three interrupted plain gut stitches. The skin was closed with 4-0 Vicryl in a  subcuticular fashion.  Final EBL was 968cc (all surgical site and was hemostatic at end of procedure) without any further bleeding on exam.   Pt tolerated the procedure well. All sponge/lap/needle counts were correct X 2. Pt taken to recovery room in stable condition.   Lucillie Garfinkel MD

## 2017-07-01 NOTE — Anesthesia Postprocedure Evaluation (Signed)
Anesthesia Post Note  Patient: Kathy Sanders  Procedure(s) Performed: Procedure(s) (LRB): CESAREAN SECTION (N/A)     Patient location during evaluation: Mother Baby Anesthesia Type: Epidural Level of consciousness: awake and alert Pain management: pain level controlled Vital Signs Assessment: post-procedure vital signs reviewed and stable Respiratory status: spontaneous breathing, nonlabored ventilation and respiratory function stable Cardiovascular status: stable Postop Assessment: no headache, no backache and epidural receding Anesthetic complications: no    Last Vitals:  Vitals:   07/01/17 1957 07/01/17 2000  BP: 95/63 96/84  Pulse: 86 84  Resp: 14 17  Temp: 37.3 C   SpO2: 98% 98%    Last Pain:  Vitals:   07/01/17 1252  TempSrc: Oral  PainSc:    Pain Goal:                 Kathy Sanders

## 2017-07-01 NOTE — Anesthesia Postprocedure Evaluation (Signed)
Anesthesia Post Note  Patient: Kathy Sanders  Procedure(s) Performed: Procedure(s) (LRB): CESAREAN SECTION (N/A)     Anesthesia Type: Epidural    Last Vitals:  Vitals:   07/01/17 1957 07/01/17 2000  BP: 95/63 96/84  Pulse: 86 84  Resp: 14 17  Temp: 37.3 C   SpO2: 98% 98%    Last Pain:  Vitals:   07/01/17 2000  TempSrc:   PainSc: (P) 0-No pain   Pain Goal:                 [name]

## 2017-07-01 NOTE — Progress Notes (Signed)
Pt is a G1P0 at 39.2 weeks, admitted at 0/90/-2, currently has epidural, no cervical change however clear fluid noted on pad when pt repositioned.  Pitocin started.  Pt is comfortable and has no needs at his time.

## 2017-07-01 NOTE — Transfer of Care (Signed)
Immediate Anesthesia Transfer of Care Note  Patient: Kathy Sanders  Procedure(s) Performed: Procedure(s): CESAREAN SECTION (N/A)  Patient Location: PACU  Anesthesia Type:Epidural  Level of Consciousness: oriented  Airway & Oxygen Therapy: Patient Spontanous Breathing  Post-op Assessment: Report given to RN  Post vital signs: Reviewed and stable  Last Vitals:  Vitals:   07/01/17 1500 07/01/17 1700  BP:    Pulse:    Resp:    Temp: 37.2 C 37.3 C  SpO2:      Last Pain:  Vitals:   07/01/17 1252  TempSrc: Oral  PainSc:          Complications: No apparent anesthesia complications

## 2017-07-01 NOTE — Progress Notes (Signed)
Forceps applied and tension applied through one contraction.  Md Unable to achieve movement, PCS advised.  Pt consented for PCS.

## 2017-07-01 NOTE — Progress Notes (Signed)
C/c/0 ~ 3.5 hours ago. Pushing x 2.5 hours with good effort. Head manually rotated to OA ~ 1hour ago. Fetal vertex now c/c/+2. FHT overall reassuring with early decels, + scalp stim. CTM closely. Vacuum, forceps,and CS d/w patient. At this point given good descent and FHT overall reassuring, will continue to push. Will continue to re-assess.

## 2017-07-01 NOTE — Anesthesia Procedure Notes (Signed)
Epidural Patient location during procedure: OB Start time: 07/01/2017 1:42 AM  Staffing Anesthesiologist: Josephine Igo Performed: anesthesiologist   Preanesthetic Checklist Completed: patient identified, site marked, surgical consent, pre-op evaluation, timeout performed, IV checked, risks and benefits discussed and monitors and equipment checked  Epidural Patient position: sitting Prep: site prepped and draped and DuraPrep Patient monitoring: continuous pulse ox and blood pressure Approach: midline Location: L4-L5 Injection technique: LOR air  Needle:  Needle type: Tuohy  Needle gauge: 17 G Needle length: 9 cm and 9 Needle insertion depth: 4 cm Catheter type: closed end flexible Catheter size: 19 Gauge Catheter at skin depth: 9 cm Test dose: negative and Other  Assessment Events: blood not aspirated, injection not painful, no injection resistance, negative IV test and no paresthesia  Additional Notes Patient identified. Risks and benefits discussed including failed block, incomplete  Pain control, post dural puncture headache, nerve damage, paralysis, blood pressure Changes, nausea, vomiting, reactions to medications-both toxic and allergic and post Partum back pain. All questions were answered. Patient expressed understanding and wished to proceed. Sterile technique was used throughout procedure. Epidural site was Dressed with sterile barrier dressing. No paresthesias, signs of intravascular injection Or signs of intrathecal spread were encountered.  Patient was more comfortable after the epidural was dosed. Please see RN's note for documentation of vital signs and FHR which are stable.

## 2017-07-01 NOTE — Progress Notes (Signed)
Md Ledger at [facility] for delivery.  Pt has been consented for OVD as well as PCS in the event of failed vac or forceps.  Delivery team in the room, room ready for delivery.

## 2017-07-01 NOTE — Anesthesia Preprocedure Evaluation (Signed)
Anesthesia Evaluation  Patient identified by MRN, date of birth, ID band Patient awake    Reviewed: Allergy & Precautions, H&P , Patient's Chart, lab work & pertinent test results  Airway Mallampati: II  TM Distance: >3 FB Neck ROM: full    Dental no notable dental hx. (+) Teeth Intact   Pulmonary neg pulmonary ROS,    Pulmonary exam normal breath sounds clear to auscultation       Cardiovascular negative cardio ROS Normal cardiovascular exam Rhythm:regular Rate:Normal     Neuro/Psych negative neurological ROS  negative psych ROS   GI/Hepatic negative GI ROS, Neg liver ROS, GERD  ,  Endo/Other  negative endocrine ROSObesity  Renal/GU negative Renal ROS  negative genitourinary   Musculoskeletal   Abdominal   Peds  Hematology negative hematology ROS (+) anemia ,   Anesthesia Other Findings   Reproductive/Obstetrics (+) Pregnancy                             Anesthesia Physical Anesthesia Plan  ASA: II  Anesthesia Plan: Epidural   Post-op Pain Management:    Induction:   PONV Risk Score and Plan:   Airway Management Planned:   Additional Equipment:   Intra-op Plan:   Post-operative Plan:   Informed Consent: I have reviewed the patients History and Physical, chart, labs and discussed the procedure including the risks, benefits and alternatives for the proposed anesthesia with the patient or authorized representative who has indicated his/her understanding and acceptance.     Plan Discussed with: Anesthesiologist  Anesthesia Plan Comments:         Anesthesia Quick Evaluation

## 2017-07-01 NOTE — Anesthesia Postprocedure Evaluation (Signed)
Anesthesia Post Note  Patient: Kathy Sanders  Procedure(s) Performed: Procedure(s) (LRB): CESAREAN SECTION (N/A)     Patient location during evaluation: Mother Baby Anesthesia Type: Epidural Level of consciousness: awake and alert Pain management: pain level controlled Vital Signs Assessment: post-procedure vital signs reviewed and stable Respiratory status: spontaneous breathing, nonlabored ventilation and respiratory function stable Cardiovascular status: stable Postop Assessment: no headache, no backache and epidural receding Anesthetic complications: no    Last Vitals:  Vitals:   07/01/17 1957 07/01/17 2000  BP: 95/63 96/84  Pulse: 86 84  Resp: 14 17  Temp: 37.3 C   SpO2: 98% 98%    Last Pain:  Vitals:   07/01/17 2000  TempSrc:   PainSc: (P) 0-No pain   Pain Goal:                 [name]

## 2017-07-01 NOTE — Brief Op Note (Signed)
06/30/2017 - 07/01/2017  8:26 PM  PATIENT:  Kathy Sanders  36 y.o. female  PRE-OPERATIVE DIAGNOSIS:  * No pre-op diagnosis entered *  POST-OPERATIVE DIAGNOSIS:  * No post-op diagnosis entered *  PROCEDURE:  Procedure(s): CESAREAN SECTION (N/A)  SURGEON:  Surgeon(s) and Role:    * Damarko Stitely, Colin Benton, MD - Primary  PHYSICIAN ASSISTANT:   ASSISTANTS: none   ANESTHESIA:   epidural  EBL:  Total I/O In: 1500 [I.V.:1500] Out: 1093 [Urine:125; Blood:968]  BLOOD ADMINISTERED:none  DRAINS: Urinary Catheter (Foley)   LOCAL MEDICATIONS USED:  NONE  SPECIMEN:  Source of Specimen:  placenta  DISPOSITION OF SPECIMEN:  PATHOLOGY  COUNTS:  YES  TOURNIQUET:  * No tourniquets in log *  DICTATION: .Note written in EPIC  PLAN OF CARE: Admit to inpatient   PATIENT DISPOSITION:  PACU - hemodynamically stable.   Delay start of Pharmacological VTE agent (>24hrs) due to surgical blood loss or risk of bleeding: yes

## 2017-07-01 NOTE — Progress Notes (Signed)
Anesthesia MD at [facility] to begin dosing of epidural for PCS.

## 2017-07-01 NOTE — H&P (Signed)
Kathy Sanders is a 36 y.o. female presenting for labor and s/p SROM. H/o LEEP. 0cm on admission but 90: effaced (prev long), very uncomf and requesting cle. CLE now in. OB History    Gravida Para Term Preterm AB Living   1             SAB TAB Ectopic Multiple Live Births                 Past Medical History:  Diagnosis Date  . Chicken pox    History reviewed. No pertinent surgical history. Family History: family history includes Alcohol abuse in her maternal uncle; Asthma in her paternal grandmother; Cancer in her maternal grandmother and maternal uncle; Mental retardation in her mother. Social History:  reports that she has never smoked. She has never used smokeless tobacco. She reports that she drinks alcohol. Her drug history is not on file.     Maternal Diabetes: No Genetic Screening: Normal Maternal Ultrasounds/Referrals: Normal Fetal Ultrasounds or other Referrals:  None Maternal Substance Abuse:  No Significant Maternal Medications:  None Significant Maternal Lab Results:  None Other Comments:  None  ROS History Dilation: Closed Effacement (%): 90 Station: -3 Exam by:: Gailen Shelter RN Blood pressure 114/84, pulse 97, temperature 97.9 F (36.6 C), temperature source Oral, resp. rate 18, height 5\' 10"  (1.778 m), weight 94.8 kg (209 lb), SpO2 98 %. Exam Physical Exam  NAD, A&O NWOB Abd soft, nondistended, gravid SVE 0 --> 6/100/-1 after LEEP scar gently opened manually  Prenatal labs: ABO, Rh: --/--/A NEG, A NEG (08/16 0105) Antibody: NEG (08/16 0105) Rubella: Immune (01/10 0000) RPR: Nonreactive (01/10 0000)  HBsAg: Negative (01/10 0000)  HIV: Non-reactive (01/10 0000)  GBS: Negative (08/16 0000)   Assessment/Plan: 36yo G1P0 @39 .2wga p/w labor and s/p SROM.  H/o LEEP - significantly cervical scar gently open manually and pt now 6cm. Cont pitocin.  RH neg - for rhogam eval pp  FHT cat 1 GBS neg  Tyson Dense 07/01/2017, 8:40 AM

## 2017-07-01 NOTE — Anesthesia Pain Management Evaluation Note (Signed)
  CRNA Pain Management Visit Note  Patient: Kathy Sanders, 36 y.o., female  "Hello I am a member of the anesthesia team at [facility]. We have an anesthesia team available at all times to provide care throughout the hospital, including epidural management and anesthesia for C-section. I don't know your plan for the delivery whether it a natural birth, water birth, IV sedation, nitrous supplementation, doula or epidural, but we want to meet your pain goals."   1.Was your pain managed to your expectations on prior hospitalizations?   No prior hospitalizations  2.What is your expectation for pain management during this hospitalization?     Epidural  3.How can we help you reach that goal?   Record the patient's initial score and the patient's pain goal.   Pain: 0  Pain Goal: 4 [facility] wants you to be able to say your pain was always managed very well.  Jabier Mutton 07/01/2017

## 2017-07-02 ENCOUNTER — Encounter (HOSPITAL_COMMUNITY): Payer: Self-pay | Admitting: Obstetrics and Gynecology

## 2017-07-02 LAB — CBC
HCT: 24.2 % — ABNORMAL LOW (ref 36.0–46.0)
Hemoglobin: 8.1 g/dL — ABNORMAL LOW (ref 12.0–15.0)
MCH: 27.9 pg (ref 26.0–34.0)
MCHC: 33.5 g/dL (ref 30.0–36.0)
MCV: 83.4 fL (ref 78.0–100.0)
PLATELETS: 180 10*3/uL (ref 150–400)
RBC: 2.9 MIL/uL — AB (ref 3.87–5.11)
RDW: 15.9 % — AB (ref 11.5–15.5)
WBC: 14.6 10*3/uL — AB (ref 4.0–10.5)

## 2017-07-02 MED ORDER — RHO D IMMUNE GLOBULIN 1500 UNIT/2ML IJ SOSY
300.0000 ug | PREFILLED_SYRINGE | Freq: Once | INTRAMUSCULAR | Status: AC
  Administered 2017-07-03: 300 ug via INTRAVENOUS
  Filled 2017-07-02: qty 2

## 2017-07-02 NOTE — Progress Notes (Signed)
Subjective: Postpartum Day 1: Cesarean Delivery Patient reports tolerating PO.    Objective: Vital signs in last 24 hours: Temp:  [98.1 F (36.7 C)-99.6 F (37.6 C)] 98.8 F (37.1 C) (08/16 2352) Pulse Rate:  [72-122] 75 (08/16 2352) Resp:  [11-18] 18 (08/16 2352) BP: (95-137)/(54-84) 129/68 (08/16 2352) SpO2:  [95 %-98 %] 98 % (08/17 0210)  Physical Exam:  General: alert Lochia: appropriate Uterine Fundus: firm Incision: healing well DVT Evaluation: No evidence of DVT seen on physical exam.   Recent Labs  07/01/17 0105 07/02/17 0606  HGB 11.6* 8.1*  HCT 34.9* 24.2*    Assessment/Plan: Status post Cesarean section. Doing well postoperatively.  Continue current care  Cbc in am.  Margarette Asal 07/02/2017, 8:20 AM

## 2017-07-02 NOTE — Progress Notes (Signed)
MOB was referred for history of depression/anxiety. * Referral screened out by Clinical Social Worker because none of the following criteria appear to apply: ~ History of anxiety/depression during this pregnancy, or of post-partum depression. ~ Diagnosis of anxiety and/or depression within last 3 years OR * MOB's symptoms currently being treated with medication and/or therapy. Please contact the Clinical Social Worker if needs arise, by [facility] request, or if MOB scores 9 or greater/yes to question 10 on Edinburgh Postpartum Depression Screen.

## 2017-07-02 NOTE — Lactation Note (Signed)
This note was copied from a baby's chart. Lactation Consultation Note  Patient Name: Kathy Sanders [id number] Date: 07/02/2017 Reason for consult: Initial assessment  Baby 75 hours old. Mom reports that baby is latching and she is seeing colostrum all around baby's mouth while nursing. Discussed cluster-feeding and enc mom to continue offering lots of STS and latching with cues.   Maternal Data Has patient been taught Hand Expression?: Yes (Per mom.) Does the patient have breastfeeding experience prior to this delivery?: No  Feeding Feeding Type: Breast Fed Length of feed: 15 min  LATCH Score                   Interventions Interventions: Breast feeding basics reviewed  Lactation Tools Discussed/Used     Consult Status Consult Status: Follow-up Date: 07/03/17 Follow-up type: In-patient    Andres Labrum 07/02/2017, 2:18 PM

## 2017-07-03 LAB — CBC
HEMATOCRIT: 24.2 % — AB (ref 36.0–46.0)
Hemoglobin: 7.8 g/dL — ABNORMAL LOW (ref 12.0–15.0)
MCH: 27.6 pg (ref 26.0–34.0)
MCHC: 32.2 g/dL (ref 30.0–36.0)
MCV: 85.5 fL (ref 78.0–100.0)
Platelets: 195 10*3/uL (ref 150–400)
RBC: 2.83 MIL/uL — ABNORMAL LOW (ref 3.87–5.11)
RDW: 16.1 % — AB (ref 11.5–15.5)
WBC: 16.6 10*3/uL — ABNORMAL HIGH (ref 4.0–10.5)

## 2017-07-03 MED ORDER — IBUPROFEN 600 MG PO TABS: 600.0000 mg | ORAL_TABLET | Freq: Four times a day (QID) | ORAL | 1 refills | Status: DC

## 2017-07-03 MED ORDER — OXYCODONE HCL 5 MG PO TABS: 5.0000 mg | ORAL_TABLET | ORAL | 0 refills | Status: DC | PRN

## 2017-07-03 MED ORDER — FERROUS SULFATE 325 (65 FE) MG PO TABS: 325.0000 mg | ORAL_TABLET | Freq: Two times a day (BID) | ORAL | 2 refills | Status: DC

## 2017-07-03 NOTE — Discharge Summary (Signed)
Obstetric Discharge Summary Reason for Admission: onset of labor Prenatal Procedures: none Intrapartum Procedures: cesarean: low cervical, transverse Postpartum Procedures: none Complications-Operative and Postpartum: none Hemoglobin  Date Value Ref Range Status  07/03/2017 7.8 (L) 12.0 - 15.0 g/dL Final   HCT  Date Value Ref Range Status  07/03/2017 24.2 (L) 36.0 - 46.0 % Final    Physical Exam:  General: alert Lochia: appropriate Uterine Fundus: firm Incision: healing well DVT Evaluation: No evidence of DVT seen on physical exam.  Discharge Diagnoses: Term Pregnancy-delivered  Discharge Information: Date: 07/03/2017 Activity: pelvic rest Diet: routine Medications: PNV, Ibuprofen, Iron and Percocet Condition: stable Instructions: refer to practice specific booklet Discharge to: home [place] Schedule an appointment as soon as possible for a visit in 7 day(s).   Contact information: [place] [address] [phone]           Newborn Data: Live born female  Birth Weight: 8 lb 9.6 oz (3900 g) APGAR: 7, 9  Home with mother.  Kathy Sanders 07/03/2017, 9:48 AM

## 2017-07-03 NOTE — Lactation Note (Addendum)
This note was copied from a baby's chart. Lactation Consultation Note  Patient Name: Kathy Sanders [id number] Date: 07/03/2017 Reason for consult: Follow-up assessment  Baby 51 hours old. Mom reports that baby has been sleepy at breast. Baby was circumcised this morning, so discussed infant behavior. Mom states that her nipples are starting to get sore. Assisted mom with positioning and latching baby in football position to right breast. Baby able to maintain a deep latch and nurse with lips flanged and some swallows noted--and mom reported increased comfort. Demonstrated to mom how to hand express and spoon-feed baby, and baby tolerated well. However, baby had some trouble extending tongue over lower gumline and above midline. Enc mom to discuss with pediatrician if this continues. Discussed progression of milk coming to volume. Enc mom to continue putting baby to breast with cues and then hand express and spoon-feed afterwards to see that her EBM volumes are increasing.   Mom had nipple cream at bedside. Enc using EBM and brought coconut oil for mom to use. Discussed assessment and interventions with Deloris Ping, RN.  Maternal Data    Feeding Feeding Type: Breast Fed Length of feed: 15 min  LATCH Score Latch: Repeated attempts needed to sustain latch, nipple held in mouth throughout feeding, stimulation needed to elicit sucking reflex.  Audible Swallowing: A few with stimulation  Type of Nipple: Everted at rest and after stimulation  Comfort (Breast/Nipple): Filling, red/small blisters or bruises, mild/mod discomfort  Hold (Positioning): Assistance needed to correctly position infant at breast and maintain latch.  LATCH Score: 6  Interventions Interventions: Assisted with latch;Skin to skin;Hand express;Breast compression;Adjust position;Support pillows;Position options;Expressed milk;Coconut oil  Lactation Tools Discussed/Used Tools: Coconut oil   Consult Status Consult Status:  Follow-up Date: 07/04/17 Follow-up type: In-patient    Kathy Sanders 07/03/2017, 3:48 PM

## 2017-07-03 NOTE — Progress Notes (Addendum)
Rn called lab regarding the patient's Rho (d) immune globulin vaccination. Rn verified with SAM ( Lab tech) no one from lab called to notify Rn that the vaccination was ready.

## 2017-07-04 ENCOUNTER — Encounter (HOSPITAL_COMMUNITY): Payer: Self-pay | Admitting: *Deleted

## 2017-07-04 LAB — RH IG WORKUP (INCLUDES ABO/RH)
ABO/RH(D): A NEG
ANTIBODY SCREEN: NEGATIVE
Fetal Screen: NEGATIVE
Gestational Age(Wks): 39.3
UNIT DIVISION: 0

## 2017-07-04 NOTE — Lactation Note (Signed)
This note was copied from a baby's chart. Lactation Consultation Note  Patient Name: Kathy Sanders [id number] Date: 07/04/2017 Reason for consult: Follow-up assessment;Difficult latch  Baby 91 hours old. Mom reports that she has only been seeing drops of EBM when hand expressing, so she has been latching the baby and then supplementing with formula. Mom able to latch baby in cross-cradle position, and baby able to latch deeply and suckle rhythmically with lips flanged. However, baby has a nursing blister in the center of upper lip and some dimpling of cheeks noted while baby suckling at the breast. Baby sleepy at the breast, but suckles with stimulation. Discussed how breast milk flow at breast impacts baby's willingness to continue nursing. Enc pumping with DEBP to protect milk supply, and mom reports she will pump at home with personal pump. Reviewed possible tight anterior frenulum with parents and how this can impact breastfeeding. Parents report that they are going to see [place] at pediatrician's practice. Enc mom to nurse baby with cues, then supplement with EBM/formula, and then post-pump followed by hand expression. Referred parents to "Mother and [place]" book for EBM storage guidelines and parents aware of OP/BFSG and [place] phone line assistance after D/C.   Maternal Data    Feeding Feeding Type: Breast Fed Length of feed: 5 min  LATCH Score Latch: Grasps breast easily, tongue down, lips flanged, rhythmical sucking.  Audible Swallowing: None  Type of Nipple: Everted at rest and after stimulation  Comfort (Breast/Nipple): Filling, red/small blisters or bruises, mild/mod discomfort  Hold (Positioning): No assistance needed to correctly position infant at breast.  LATCH Score: 7  Interventions Interventions: Breast compression;Support pillows;Hand express  Lactation Tools Discussed/Used Tools: Coconut oil   Consult Status Consult Status: Follow-up Date: 07/05/17 Follow-up  type: In-patient    Andres Labrum 07/04/2017, 11:11 AM

## 2017-07-26 ENCOUNTER — Encounter: Payer: Self-pay | Admitting: Primary Care

## 2017-07-26 ENCOUNTER — Encounter: Payer: Self-pay | Admitting: *Deleted

## 2018-06-15 ENCOUNTER — Encounter: Payer: Self-pay | Admitting: Primary Care

## 2018-06-15 ENCOUNTER — Ambulatory Visit (INDEPENDENT_AMBULATORY_CARE_PROVIDER_SITE_OTHER): Payer: BC Managed Care – PPO | Admitting: Primary Care

## 2018-06-15 VITALS — BP 112/70 | HR 72 | Temp 98.1°F | Ht 70.0 in | Wt 175.2 lb

## 2018-06-15 DIAGNOSIS — F411 Generalized anxiety disorder: Secondary | ICD-10-CM | POA: Diagnosis not present

## 2018-06-15 DIAGNOSIS — R519 Headache, unspecified: Secondary | ICD-10-CM

## 2018-06-15 DIAGNOSIS — L301 Dyshidrosis [pompholyx]: Secondary | ICD-10-CM | POA: Diagnosis not present

## 2018-06-15 DIAGNOSIS — Z0000 Encounter for general adult medical examination without abnormal findings: Secondary | ICD-10-CM | POA: Diagnosis not present

## 2018-06-15 DIAGNOSIS — R51 Headache: Secondary | ICD-10-CM | POA: Diagnosis not present

## 2018-06-15 LAB — COMPREHENSIVE METABOLIC PANEL
ALT: 11 U/L (ref 0–35)
AST: 14 U/L (ref 0–37)
Albumin: 3.9 g/dL (ref 3.5–5.2)
Alkaline Phosphatase: 42 U/L (ref 39–117)
BILIRUBIN TOTAL: 0.3 mg/dL (ref 0.2–1.2)
BUN: 9 mg/dL (ref 6–23)
CALCIUM: 9.3 mg/dL (ref 8.4–10.5)
CO2: 31 meq/L (ref 19–32)
CREATININE: 0.77 mg/dL (ref 0.40–1.20)
Chloride: 102 mEq/L (ref 96–112)
GFR: 88.69 mL/min (ref >60.00)
Glucose, Bld: 91 mg/dL (ref 70–99)
Potassium: 4.6 mEq/L (ref 3.5–5.1)
SODIUM: 137 meq/L (ref 135–145)
Total Protein: 7.2 g/dL (ref 6.0–8.3)

## 2018-06-15 LAB — LIPID PANEL
Cholesterol: 198 mg/dL (ref 0–200)
HDL: 55.9 mg/dL (ref >39.00)
LDL Cholesterol: 113 mg/dL — ABNORMAL HIGH (ref 0–99)
NONHDL: 142.44
Total CHOL/HDL Ratio: 4
Triglycerides: 145 mg/dL (ref 0.0–149.0)
VLDL: 29 mg/dL (ref 0.0–40.0)

## 2018-06-15 MED ORDER — TRIAMCINOLONE ACETONIDE 0.1 % EX CREA
1.0000 | TOPICAL_CREAM | Freq: Two times a day (BID) | CUTANEOUS | 0 refills | Status: DC
Start: 2018-06-15 — End: 2019-08-17

## 2018-06-15 NOTE — Assessment & Plan Note (Signed)
Noted to bilateral fingers today. This is a chronic issue, no improvement with OTC cortisone. Rx for triamcinolone 0.1% cream sent to pharmacy. She will update.

## 2018-06-15 NOTE — Progress Notes (Signed)
Subjective:    Patient ID: Kathy Sanders, female    DOB: September 27, 1981, 37 y.o.   MRN: [id number]  HPI  Ms. Choo is a 37 year old female who presents today for complete physical.  Immunizations: -Tetanus: Completed in 2018 -Influenza: Completed last season    Diet: She endorses a fair diet Breakfast: Yogurt, fruit, granola, oatmeal, bagel  Lunch: Salad, humus with veggies Dinner: Veggie tacos, vegetables, pasta, pizza Snacks: Popcorn, granola bars Desserts: Daily  Beverages: Water, soda, coffee  Exercise: She is not exercising Eye exam: Completed years ago Dental exam: Completes semi-annually  Pap Smear: Completed in 2017   Review of Systems  Constitutional: Negative for unexpected weight change.  HENT: Negative for rhinorrhea.   Respiratory: Negative for cough and shortness of breath.   Cardiovascular: Negative for chest pain.  Gastrointestinal: Negative for constipation and diarrhea.  Genitourinary: Negative for difficulty urinating and menstrual problem.  Musculoskeletal: Negative for arthralgias and myalgias.  Skin: Negative for rash.       Bumps to sides of fingers at various times, present intermittently for years.   Allergic/Immunologic: Negative for environmental allergies.  Neurological: Positive for headaches. Negative for numbness.  Psychiatric/Behavioral:       Feels well managed on Zoloft.        Past Medical History:  Diagnosis Date  . Chicken pox      Social History   Socioeconomic History  . Marital status: Married    Spouse name: Not on file  . Number of children: Not on file  . Years of education: Not on file  . Highest education level: Not on file  Occupational History  . Not on file  Social Needs  . Financial resource strain: Not on file  . Food insecurity:    Worry: Not on file    Inability: Not on file  . Transportation needs:    Medical: Not on file    Non-medical: Not on file  Tobacco Use  . Smoking status: Never Smoker   . Smokeless tobacco: Never Used  Substance and Sexual Activity  . Alcohol use: Yes    Alcohol/week: 0.0 oz    Comment: rare  . Drug use: Not on file  . Sexual activity: Not on file  Lifestyle  . Physical activity:    Days per week: Not on file    Minutes per session: Not on file  . Stress: Not on file  Relationships  . Social connections:    Talks on phone: Not on file    Gets together: Not on file    Attends religious service: Not on file    Active member of club or organization: Not on file    Attends meetings of clubs or organizations: Not on file    Relationship status: Not on file  . Intimate partner violence:    Fear of current or ex partner: Not on file    Emotionally abused: Not on file    Physically abused: Not on file    Forced sexual activity: Not on file  Other Topics Concern  . Not on file  Social History Narrative  . Not on file    Past Surgical History:  Procedure Laterality Date  . CESAREAN SECTION N/A 07/01/2017   Procedure: CESAREAN SECTION;  Surgeon: Tyson Dense, MD;  Location: [facility];  Service: Obstetrics;  Laterality: N/A;    Family History  Problem Relation Age of Onset  . Alcohol abuse Maternal Uncle   . Cancer Maternal  Uncle        prostate  . Cancer Maternal Grandmother        breast  . Asthma Paternal Grandmother     Allergies  Allergen Reactions  . Amoxicillin Nausea Only and Nausea And Vomiting    Has patient had a PCN reaction causing immediate rash, facial/tongue/throat swelling, SOB or lightheadedness with hypotension: No Has patient had a PCN reaction causing severe rash involving mucus membranes or skin necrosis: No Has patient had a PCN reaction that required hospitalization: No Has patient had a PCN reaction occurring within the last 10 years: No If all of the above answers are "NO", then may proceed with Cephalosporin use.     Current Outpatient Medications on File Prior to Visit  Medication Sig  Dispense Refill  . Multiple Vitamins-Minerals (ALIVE WOMENS GUMMY) CHEW     . Norethin Ace-Eth Estrad-FE (JUNEL FE 1/20 PO)     . sertraline (ZOLOFT) 50 MG tablet      No current facility-administered medications on file prior to visit.     BP 112/70   Pulse 72   Temp 98.1 F (36.7 C) (Oral)   Ht 5\' 10"  (1.778 m)   Wt 175 lb 4 oz (79.5 kg)   SpO2 99%   Breastfeeding? No   BMI 25.15 kg/m    Objective:   Physical Exam  Constitutional: She is oriented to person, place, and time. She appears well-nourished.  HENT:  Mouth/Throat: No oropharyngeal exudate.  Eyes: Pupils are equal, round, and reactive to light. EOM are normal.  Neck: Neck supple. No thyromegaly present.  Cardiovascular: Normal rate and regular rhythm.  Respiratory: Effort normal and breath sounds normal.  GI: Soft. Bowel sounds are normal. There is no tenderness.  Musculoskeletal: Normal range of motion.  Neurological: She is alert and oriented to person, place, and time.  Skin: Skin is warm and dry.  Flesh colored bumps to bilateral lateral index digits.   Psychiatric: She has a normal mood and affect.           Assessment & Plan:

## 2018-06-15 NOTE — Assessment & Plan Note (Signed)
Immunizations UTD. Pap smear UTD per patient, follows with GYN.  Recommended regular exercise.  Exam with chronic and stable eczema, otherwise unremarkable. Labs pending. Follow up in 1 year for CPE.

## 2018-06-15 NOTE — Patient Instructions (Signed)
Apply the triamcinolone cream twice daily for one week at a time. Use again as needed.   Stop by the lab prior to leaving today. I will notify you of your results once received.   Start exercising. You should be getting 150 minutes of moderate intensity exercise weekly.  Ensure you are consuming 64 ounces of water daily.  Follow up in 1 year for your annual exam or sooner if needed.  It was a pleasure to see you today!   Preventive Care 18-39 Years, Female Preventive care refers to lifestyle choices and visits with your health care provider that can promote health and wellness. What does preventive care include?  A yearly physical exam. This is also called an annual well check.  Dental exams once or twice a year.  Routine eye exams. Ask your health care provider how often you should have your eyes checked.  Personal lifestyle choices, including: ? Daily care of your teeth and gums. ? Regular physical activity. ? Eating a healthy diet. ? Avoiding tobacco and drug use. ? Limiting alcohol use. ? Practicing safe sex. ? Taking vitamin and mineral supplements as recommended by your health care provider. What happens during an annual well check? The services and screenings done by your health care provider during your annual well check will depend on your age, overall health, lifestyle risk factors, and family history of disease. Counseling Your health care provider may ask you questions about your:  Alcohol use.  Tobacco use.  Drug use.  Emotional well-being.  Home and relationship well-being.  Sexual activity.  Eating habits.  Work and work Statistician.  Method of birth control.  Menstrual cycle.  Pregnancy history.  Screening You may have the following tests or measurements:  Height, weight, and BMI.  Diabetes screening. This is done by checking your blood sugar (glucose) after you have not eaten for a while (fasting).  Blood pressure.  Lipid and  cholesterol levels. These may be checked every 5 years starting at age 2.  Skin check.  Hepatitis C blood test.  Hepatitis B blood test.  Sexually transmitted disease (STD) testing.  BRCA-related cancer screening. This may be done if you have a family history of breast, ovarian, tubal, or peritoneal cancers.  Pelvic exam and Pap test. This may be done every 3 years starting at age 45. Starting at age 2, this may be done every 5 years if you have a Pap test in combination with an HPV test.  Discuss your test results, treatment options, and if necessary, the need for more tests with your health care provider. Vaccines Your health care provider may recommend certain vaccines, such as:  Influenza vaccine. This is recommended every year.  Tetanus, diphtheria, and acellular pertussis (Tdap, Td) vaccine. You may need a Td booster every 10 years.  Varicella vaccine. You may need this if you have not been vaccinated.  HPV vaccine. If you are 73 or younger, you may need three doses over 6 months.  Measles, mumps, and rubella (MMR) vaccine. You may need at least one dose of MMR. You may also need a second dose.  Pneumococcal 13-valent conjugate (PCV13) vaccine. You may need this if you have certain conditions and were not previously vaccinated.  Pneumococcal polysaccharide (PPSV23) vaccine. You may need one or two doses if you smoke cigarettes or if you have certain conditions.  Meningococcal vaccine. One dose is recommended if you are age 5-21 years and a Market researcher living in a residence hall, or  if you have one of several medical conditions. You may also need additional booster doses.  Hepatitis A vaccine. You may need this if you have certain conditions or if you travel or work in places where you may be exposed to hepatitis A.  Hepatitis B vaccine. You may need this if you have certain conditions or if you travel or work in places where you may be exposed to hepatitis  B.  Haemophilus influenzae type b (Hib) vaccine. You may need this if you have certain risk factors.  Talk to your health care provider about which screenings and vaccines you need and how often you need them. This information is not intended to replace advice given to you by your health care provider. Make sure you discuss any questions you have with your health care provider. Document Released: 12/29/2001 Document Revised: 07/22/2016 Document Reviewed: 09/03/2015 Elsevier Interactive Patient Education  [employer].

## 2018-06-15 NOTE — Assessment & Plan Note (Signed)
Doing well on Zoloft 50 mg, feels well managed.  Denies SI/HI. She is no longer breast feeding.

## 2018-06-15 NOTE — Assessment & Plan Note (Signed)
Intermittent, several times weekly. Located to frontal lobe bilaterally. No improvement with Ibuprofen, Tylenol. She will try Excedrin Migraine and update if headaches persist.

## 2019-06-18 ENCOUNTER — Other Ambulatory Visit: Payer: Self-pay | Admitting: Primary Care

## 2019-06-18 DIAGNOSIS — Z0000 Encounter for general adult medical examination without abnormal findings: Secondary | ICD-10-CM

## 2019-06-19 ENCOUNTER — Other Ambulatory Visit: Payer: Self-pay

## 2019-06-19 ENCOUNTER — Other Ambulatory Visit: Payer: BC Managed Care – PPO

## 2019-06-19 ENCOUNTER — Ambulatory Visit (INDEPENDENT_AMBULATORY_CARE_PROVIDER_SITE_OTHER): Payer: BC Managed Care – PPO | Admitting: Primary Care

## 2019-06-19 ENCOUNTER — Encounter: Payer: Self-pay | Admitting: Primary Care

## 2019-06-19 VITALS — BP 112/72 | HR 72 | Temp 98.0°F | Ht 70.0 in | Wt 177.0 lb

## 2019-06-19 DIAGNOSIS — L301 Dyshidrosis [pompholyx]: Secondary | ICD-10-CM | POA: Diagnosis not present

## 2019-06-19 DIAGNOSIS — R519 Headache, unspecified: Secondary | ICD-10-CM

## 2019-06-19 DIAGNOSIS — R51 Headache: Secondary | ICD-10-CM | POA: Diagnosis not present

## 2019-06-19 DIAGNOSIS — Z0000 Encounter for general adult medical examination without abnormal findings: Secondary | ICD-10-CM | POA: Diagnosis not present

## 2019-06-19 DIAGNOSIS — F411 Generalized anxiety disorder: Secondary | ICD-10-CM | POA: Diagnosis not present

## 2019-06-19 LAB — COMPREHENSIVE METABOLIC PANEL
ALT: 14 U/L (ref 0–35)
AST: 18 U/L (ref 0–37)
Albumin: 4.2 g/dL (ref 3.5–5.2)
Alkaline Phosphatase: 47 U/L (ref 39–117)
BUN: 10 mg/dL (ref 6–23)
CO2: 29 mEq/L (ref 19–32)
Calcium: 9.6 mg/dL (ref 8.4–10.5)
Chloride: 100 mEq/L (ref 96–112)
Creatinine, Ser: 0.75 mg/dL (ref 0.40–1.20)
GFR: 85.57 mL/min (ref >60.00)
Glucose, Bld: 81 mg/dL (ref 70–99)
Potassium: 4.6 mEq/L (ref 3.5–5.1)
Sodium: 136 mEq/L (ref 135–145)
Total Bilirubin: 0.4 mg/dL (ref 0.2–1.2)
Total Protein: 7.1 g/dL (ref 6.0–8.3)

## 2019-06-19 LAB — LIPID PANEL
Cholesterol: 214 mg/dL — ABNORMAL HIGH (ref 0–200)
HDL: 65 mg/dL (ref >39.00)
LDL Cholesterol: 135 mg/dL — ABNORMAL HIGH (ref 0–99)
NonHDL: 148.79
Total CHOL/HDL Ratio: 3
Triglycerides: 68 mg/dL (ref 0.0–149.0)
VLDL: 13.6 mg/dL (ref 0.0–40.0)

## 2019-06-19 MED ORDER — SERTRALINE HCL 100 MG PO TABS: 100.0000 mg | ORAL_TABLET | Freq: Every day | ORAL | 1 refills | Status: DC

## 2019-06-19 NOTE — Assessment & Plan Note (Signed)
Increased symptoms over the last 6 months, doesn't feel as though current dose of Zoloft is therapeutic. Increase dose to 100 mg. She will update in a few weeks. New Rx sent to pharmacy.

## 2019-06-19 NOTE — Assessment & Plan Note (Signed)
Slightly improved overall but without resolve now as she's washing hands more frequently since Covid-19 and has increased stress. Continue triamcinolone cream for now. She will update.

## 2019-06-19 NOTE — Progress Notes (Signed)
Subjective:    Patient ID: Kathy Sanders, female    DOB: 08-May-1981, 38 y.o.   MRN: [id number]  HPI  Kathy Sanders is a 38 year old female who presents today for complete physical. She would also like to discuss increasing her dose of Zoloft.  Immunizations: -Tetanus: Completed in 2018 -Influenza: Due this season   Diet: She endorses a fair diet. Yogurt with fruit/granola, smoothies, sandwich, take out food, pizza, tacos, pasta. Desserts daily. Drinking mostly water, coffee in the morning, occasional soda Exercise: She is not exercising, some walking  Eye exam: Completed several years ago Dental exam: Completes semi-annually  Pap Smear: Follows with GYN.  Mammogram: Will get this year at GYN office.  She feels as though her Zoloft needs to be increased. Symptoms of easy irritability, feeling anxious nervous, decreased patience, difficulty sleeping which has been going on for the last 6 months. She feels as though Zoloft helped in the beginning but feels it's not as effective at this point. 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  Review of Systems  Constitutional: Negative for unexpected weight change.  HENT: Negative for rhinorrhea.   Respiratory: Negative for cough and shortness of breath.   Cardiovascular: Negative for chest pain.  Gastrointestinal: Negative for constipation and diarrhea.  Genitourinary: Negative for difficulty urinating and menstrual problem.  Musculoskeletal: Positive for arthralgias.       Intermittent back pain  Skin: Negative for rash.  Allergic/Immunologic: Negative for environmental allergies.  Neurological: Negative for dizziness, numbness and headaches.  Psychiatric/Behavioral: The patient is nervous/anxious.        See HPI       Past Medical History:  Diagnosis Date  . Chicken pox      Social History   Socioeconomic History  . Marital status: Married    Spouse name: Not on file  . Number of children:  Not on file  . Years of education: Not on file  . Highest education level: Not on file  Occupational History  . Not on file  Social Needs  . Financial resource strain: Not on file  . Food insecurity    Worry: Not on file    Inability: Not on file  . Transportation needs    Medical: Not on file    Non-medical: Not on file  Tobacco Use  . Smoking status: Never Smoker  . Smokeless tobacco: Never Used  Substance and Sexual Activity  . Alcohol use: Yes    Alcohol/week: 0.0 standard drinks    Comment: rare  . Drug use: Not on file  . Sexual activity: Not on file  Lifestyle  . Physical activity    Days per week: Not on file    Minutes per session: Not on file  . Stress: Not on file  Relationships  . Social Herbalist on phone: Not on file    Gets together: Not on file    Attends religious service: Not on file    Active member of club or organization: Not on file    Attends meetings of clubs or organizations: Not on file    Relationship status: Not on file  . Intimate partner violence    Fear of current or ex partner: Not on file    Emotionally abused: Not on file    Physically abused: Not on file    Forced sexual activity: Not on file  Other Topics Concern  . Not on file  Social History Narrative  . Not on file    Past Surgical History:  Procedure Laterality Date  . CESAREAN SECTION N/A 07/01/2017   Procedure: CESAREAN SECTION;  Surgeon: Tyson Dense, MD;  Location: [facility];  Service: Obstetrics;  Laterality: N/A;    Family History  Problem Relation Age of Onset  . Alcohol abuse Maternal Uncle   . Cancer Maternal Uncle        prostate  . Cancer Maternal Grandmother        breast  . Asthma Paternal Grandmother     Allergies  Allergen Reactions  . Amoxicillin Nausea Only and Nausea And Vomiting    Has patient had a PCN reaction causing immediate rash, facial/tongue/throat swelling, SOB or lightheadedness with hypotension: No Has  patient had a PCN reaction causing severe rash involving mucus membranes or skin necrosis: No Has patient had a PCN reaction that required hospitalization: No Has patient had a PCN reaction occurring within the last 10 years: No If all of the above answers are "NO", then may proceed with Cephalosporin use.     Current Outpatient Medications on File Prior to Visit  Medication Sig Dispense Refill  . Multiple Vitamins-Minerals (ALIVE WOMENS GUMMY) CHEW     . Norethin Ace-Eth Estrad-FE (JUNEL FE 1/20 PO)     . triamcinolone cream (KENALOG) 0.1 % Apply 1 application topically 2 (two) times daily. 30 g 0   No current facility-administered medications on file prior to visit.     BP 112/72   Pulse 72   Temp 98 F (36.7 C) (Temporal)   Ht 5\' 10"  (1.778 m)   Wt 177 lb (80.3 kg)   SpO2 100%   BMI 25.40 kg/m    Objective:   Physical Exam  Constitutional: She is oriented to person, place, and time. She appears well-nourished.  HENT:  Mouth/Throat: No oropharyngeal exudate.  Eyes: Pupils are equal, round, and reactive to light. EOM are normal.  Neck: Neck supple. No thyromegaly present.  Cardiovascular: Normal rate and regular rhythm.  Respiratory: Effort normal and breath sounds normal.  GI: Soft. Bowel sounds are normal. There is no abdominal tenderness.  Musculoskeletal: Normal range of motion.  Neurological: She is alert and oriented to person, place, and time.  Skin: Skin is warm and dry.  Psychiatric: She has a normal mood and affect.           Assessment & Plan:

## 2019-06-19 NOTE — Patient Instructions (Signed)
We've increased the dose of your sertraline (Zoloft) to 100 mg.   Start exercising. You should be getting 150 minutes of moderate intensity exercise weekly.  It's important to improve your diet by reducing consumption of fast food, fried food, processed snack foods, sugary drinks. Increase consumption of fresh vegetables and fruits, whole grains, water.  Ensure you are drinking 64 ounces of water daily.  Stop by the lab prior to leaving today. I will notify you of your results once received.   Please update me in 4 weeks in regards to anxiety levels with the increased dose of Zoloft.  It was a pleasure to see you today!

## 2019-06-19 NOTE — Assessment & Plan Note (Signed)
Continued, several times weekly on average. Overall tolerable. Continue OTC treatment.

## 2019-06-19 NOTE — Assessment & Plan Note (Signed)
Immunizations UTD. Pap smear UTD, follows with GYN. Will get mammogram this year at GYN office. Encouraged a healthy diet and regular exercise. Exam unremarkable. Labs pending. Follow up in 1 year for CPE.

## 2019-06-19 NOTE — Addendum Note (Signed)
Addended by: Ellamae Sia on: 06/19/2019 12:36 PM   Modules accepted: Orders

## 2019-06-22 ENCOUNTER — Encounter: Payer: BC Managed Care – PPO | Admitting: Primary Care

## 2019-06-27 ENCOUNTER — Encounter: Payer: BC Managed Care – PPO | Admitting: Primary Care

## 2019-08-17 ENCOUNTER — Encounter: Payer: Self-pay | Admitting: Internal Medicine

## 2019-08-17 ENCOUNTER — Other Ambulatory Visit: Payer: Self-pay

## 2019-08-17 ENCOUNTER — Ambulatory Visit (INDEPENDENT_AMBULATORY_CARE_PROVIDER_SITE_OTHER): Payer: BC Managed Care – PPO | Admitting: Internal Medicine

## 2019-08-17 VITALS — BP 116/76 | HR 96 | Temp 98.6°F | Ht 70.0 in | Wt 175.0 lb

## 2019-08-17 DIAGNOSIS — Z23 Encounter for immunization: Secondary | ICD-10-CM | POA: Diagnosis not present

## 2019-08-17 DIAGNOSIS — G8929 Other chronic pain: Secondary | ICD-10-CM | POA: Insufficient documentation

## 2019-08-17 DIAGNOSIS — S39012A Strain of muscle, fascia and tendon of lower back, initial encounter: Secondary | ICD-10-CM | POA: Diagnosis not present

## 2019-08-17 NOTE — Progress Notes (Signed)
Subjective:    Patient ID: Kathy Sanders, female    DOB: 05-Jul-1981, 38 y.o.   MRN: [id number]  HPI Here due to back pain  She nudged her dog with her foot (without pressure), while holding son on her hip May have twisted it--but had pain right away Happened 3 days ago---and no better Pain is to left of spine  Tried hearing pad, ibuprofen 800mg  once in AM (not much help) Hurts to sit--and bad getting up after prolonged sitting (then eases after she has been up for a while) Teacher --now with real time class Able to sleep okay--once she gets settled   Current Outpatient Medications on File Prior to Visit  Medication Sig Dispense Refill  . levonorgestrel (MIRENA, 52 MG,) 20 MCG/24HR IUD Mirena 20 mcg/24 hours (5 yrs) 52 mg intrauterine device  Take 1 device by intrauterine route.    . Multiple Vitamins-Minerals (ALIVE WOMENS GUMMY) CHEW     . sertraline (ZOLOFT) 100 MG tablet Take 1 tablet (100 mg total) by mouth daily. For anxiety. 90 tablet 1   No current facility-administered medications on file prior to visit.     Allergies  Allergen Reactions  . Amoxicillin Nausea Only and Nausea And Vomiting    Has patient had a PCN reaction causing immediate rash, facial/tongue/throat swelling, SOB or lightheadedness with hypotension: No Has patient had a PCN reaction causing severe rash involving mucus membranes or skin necrosis: No Has patient had a PCN reaction that required hospitalization: No Has patient had a PCN reaction occurring within the last 10 years: No If all of the above answers are "NO", then may proceed with Cephalosporin use.     Past Medical History:  Diagnosis Date  . Chicken pox     Past Surgical History:  Procedure Laterality Date  . CESAREAN SECTION N/A 07/01/2017   Procedure: CESAREAN SECTION;  Surgeon: Tyson Dense, MD;  Location: [facility];  Service: Obstetrics;  Laterality: N/A;    Family History  Problem Relation Age of Onset   . Alcohol abuse Maternal Uncle   . Cancer Maternal Uncle        prostate  . Cancer Maternal Grandmother        breast  . Asthma Paternal Grandmother     Social History   Socioeconomic History  . Marital status: Married    Spouse name: Not on file  . Number of children: Not on file  . Years of education: Not on file  . Highest education level: Not on file  Occupational History  . Not on file  Social Needs  . Financial resource strain: Not on file  . Food insecurity    Worry: Not on file    Inability: Not on file  . Transportation needs    Medical: Not on file    Non-medical: Not on file  Tobacco Use  . Smoking status: Never Smoker  . Smokeless tobacco: Never Used  Substance and Sexual Activity  . Alcohol use: Yes    Alcohol/week: 0.0 standard drinks    Comment: rare  . Drug use: Not on file  . Sexual activity: Not on file  Lifestyle  . Physical activity    Days per week: Not on file    Minutes per session: Not on file  . Stress: Not on file  Relationships  . Social Herbalist on phone: Not on file    Gets together: Not on file    Attends religious service: Not  on file    Active member of club or organization: Not on file    Attends meetings of clubs or organizations: Not on file    Relationship status: Not on file  . Intimate partner violence    Fear of current or ex partner: Not on file    Emotionally abused: Not on file    Physically abused: Not on file    Forced sexual activity: Not on file  Other Topics Concern  . Not on file  Social History Narrative  . Not on file   Review of Systems  Pain shoots up but not to legs No change in bowel or bladder function No N/V and eating okay     Objective:   Physical Exam  Constitutional: She appears well-developed. No distress.  Musculoskeletal:     Comments: No spine tenderness Tenderness lateral to L4-5 on left Normal ROM of hips SLR negative   Neurological:  Normal gait Normal strength in  legs           Assessment & Plan:

## 2019-08-17 NOTE — Patient Instructions (Signed)
Please try over the counter lidocaine 4% patches (for 12 hours per day)---called [employer]

## 2019-08-17 NOTE — Assessment & Plan Note (Signed)
Clearly seems to be muscular Discussed heat Try lidocaine patch Ibuprofen more regularly to see if helps more Reassured--should get better on its own

## 2019-08-29 LAB — HM PAP SMEAR

## 2019-08-30 LAB — HM MAMMOGRAPHY

## 2019-12-08 ENCOUNTER — Other Ambulatory Visit: Payer: Self-pay | Admitting: Primary Care

## 2019-12-08 DIAGNOSIS — F411 Generalized anxiety disorder: Secondary | ICD-10-CM

## 2020-05-03 ENCOUNTER — Ambulatory Visit: Payer: BC Managed Care – PPO | Admitting: Primary Care

## 2020-06-21 ENCOUNTER — Other Ambulatory Visit: Payer: Self-pay

## 2020-06-21 ENCOUNTER — Emergency Department
Admission: EM | Admit: 2020-06-21 | Discharge: 2020-06-22 | Disposition: A | Discharge status: Home / Self Care | Payer: BC Managed Care – PPO | Attending: Emergency Medicine | Admitting: Emergency Medicine

## 2020-06-21 ENCOUNTER — Telehealth: Payer: Self-pay

## 2020-06-21 DIAGNOSIS — K625 Hemorrhage of anus and rectum: Secondary | ICD-10-CM | POA: Diagnosis not present

## 2020-06-21 DIAGNOSIS — Z5321 Procedure and treatment not carried out due to patient leaving prior to being seen by health care provider: Secondary | ICD-10-CM | POA: Diagnosis not present

## 2020-06-21 LAB — CBC
HCT: 35.4 % — ABNORMAL LOW (ref 36.0–46.0)
Hemoglobin: 12.2 g/dL (ref 12.0–15.0)
MCH: 30.5 pg (ref 26.0–34.0)
MCHC: 34.5 g/dL (ref 30.0–36.0)
MCV: 88.5 fL (ref 80.0–100.0)
Platelets: 279 10*3/uL (ref 150–400)
RBC: 4 MIL/uL (ref 3.87–5.11)
RDW: 13.7 % (ref 11.5–15.5)
WBC: 7.3 10*3/uL (ref 4.0–10.5)
nRBC: 0 % (ref 0.0–0.2)

## 2020-06-21 LAB — COMPREHENSIVE METABOLIC PANEL
ALT: 13 U/L (ref 0–44)
AST: 18 U/L (ref 15–41)
Albumin: 4.2 g/dL (ref 3.5–5.0)
Alkaline Phosphatase: 55 U/L (ref 38–126)
Anion gap: 8 (ref 5–15)
BUN: 12 mg/dL (ref 6–20)
CO2: 25 mmol/L (ref 22–32)
Calcium: 8.9 mg/dL (ref 8.9–10.3)
Chloride: 104 mmol/L (ref 98–111)
Creatinine, Ser: 0.84 mg/dL (ref 0.44–1.00)
GFR calc Af Amer: >60 mL/min (ref >60)
GFR calc non Af Amer: >60 mL/min (ref >60)
Glucose, Bld: 104 mg/dL — ABNORMAL HIGH (ref 70–99)
Potassium: 3.6 mmol/L (ref 3.5–5.1)
Sodium: 137 mmol/L (ref 135–145)
Total Bilirubin: 0.5 mg/dL (ref 0.3–1.2)
Total Protein: 7.2 g/dL (ref 6.5–8.1)

## 2020-06-21 LAB — TYPE AND SCREEN
ABO/RH(D): A NEG
Antibody Screen: NEGATIVE

## 2020-06-21 NOTE — ED Triage Notes (Signed)
Reports bright red rectal bleeding X 2 and abdominal discomfort this AM. No hx of same. Pt alert and oriented X4, cooperative, RR even and unlabored, color WNL. Pt in NAD.

## 2020-06-21 NOTE — Telephone Encounter (Signed)
[facility] - Client TELEPHONE ADVICE RECORD [facility] Patient Name: Kathy Sanders Gender: Female DOB: [birth year] Age: 39 Y 16 D Return Phone Number: [phone] (Primary) Address: City/State/Zip: [place] [address] Client [facility] - Client Client Site [place] - Day Physician Alma Friendly - NP Contact Type Call Who Is Calling Patient / Member / Family / Caregiver Call Type Triage / Clinical Relationship To Patient Self Return Phone Number [phone] (Primary) Chief Complaint Rectal Bleeding Reason for Call Symptomatic / Request for [place] states having rectal bleeding. [place] Not Listed [place] Translation No Nurse Assessment Nurse: Clovis Riley, RN, Georgina Peer Date/Time (Eastern Time): 06/21/2020 1:00:36 PM Confirm and document reason for call. If symptomatic, describe symptoms. ---Caller states she is having rectal bleeding that started today and is bright red. States it is not with passage of stool. States she is having lower abdomen discomfort. States it is when she wipes her rectum. Has the patient had close contact with a person known or suspected to have the novel coronavirus illness OR traveled / lives in area with major community spread (including international travel) in the last 14 days from the onset of symptoms? * If Asymptomatic, screen for exposure and travel within the last 14 days. ---No Does the patient have any new or worsening symptoms? ---Yes Will a triage be completed? ---Yes Related visit to physician within the last 2 weeks? ---No Does the PT have any chronic conditions? (i.e. diabetes, asthma, this includes High risk factors for pregnancy, etc.) ---Yes List chronic conditions. ---anxiety Is the patient pregnant or possibly pregnant? (Ask all females between the ages of 71-55) ---No Is this a behavioral health or  substance abuse call? ---No Guidelines Guideline Title Affirmed Question Affirmed Notes Nurse Date/Time (Eastern Time) Rectal Bleeding [1] MODERATE rectal bleeding (small blood clots, passing blood Clovis Riley, RN, Georgina Peer 06/21/2020 1:02:54 PM PLEASE NOTE: All timestamps contained within this report are represented as [place] Standard Time. CONFIDENTIALTY NOTICE: This fax transmission is intended only for the addressee. It contains information that is legally privileged, confidential or otherwise protected from use or disclosure. If you are not the intended recipient, you are strictly prohibited from reviewing, disclosing, copying using or disseminating any of this information or taking any action in reliance on or regarding this information. If you have received this fax in error, please notify us immediately by telephone so that we can arrange for its return to [place]. Phone: [phone], Toll-Free: [phone], Fax: [phone] Page: 2 of 2 Call Id: [id number] Guidelines Guideline Title Affirmed Question Affirmed Notes Nurse Date/Time Eilene Ghazi Time) without stool, or toilet water turns red) AND [2] more than once a day Disp. Time Eilene Ghazi Time) Disposition Final User 06/21/2020 1:04:51 PM Go to ED Now Yes Clovis Riley, RN, Leilani Merl Disagree/Comply Comply Caller Understands Yes PreDisposition Did not know what to do Care Advice Given Per Guideline GO TO ED NOW: * You need to be seen in the Emergency Department. * Go to the ED at ___________ [place] now. Drive carefully. DRIVING: * Another adult should drive. CARE ADVICE given per Rectal Bleeding (Adult) guideline. Referrals GO TO FACILITY OTHER - SPECIFY

## 2020-06-21 NOTE — Telephone Encounter (Signed)
Per chart review pt is at [facility] ED. 

## 2020-06-21 NOTE — Telephone Encounter (Signed)
Noted, will review ED note 

## 2020-06-27 ENCOUNTER — Encounter: Payer: Self-pay | Admitting: Internal Medicine

## 2020-06-27 ENCOUNTER — Other Ambulatory Visit: Payer: Self-pay

## 2020-06-27 ENCOUNTER — Ambulatory Visit: Payer: BC Managed Care – PPO | Admitting: Internal Medicine

## 2020-06-27 VITALS — BP 114/78 | HR 87 | Temp 98.3°F | Wt 184.0 lb

## 2020-06-27 DIAGNOSIS — K921 Melena: Secondary | ICD-10-CM | POA: Diagnosis not present

## 2020-06-27 DIAGNOSIS — K5901 Slow transit constipation: Secondary | ICD-10-CM

## 2020-06-27 NOTE — Progress Notes (Signed)
Subjective:    Patient ID: Kathy Sanders, female    DOB: 23-Apr-1981, 39 y.o.   MRN: [id number]  HPI  Pt presents to the clinic today with c/o rectal bleeding. This occurred 1 day on 06/21/20. She went to the ED but LWBS due to the wait. She noticed blood in her stool and with wiping. She has had some issues with constipation and external hemorrhoids in the past, but has never had bleeding. She denies rectal itching or burning at this time. She denies nausea, vomiting, current abdominal pain, diarrhea or recurrent blood in her stool. She has not taken anything OTC for this. She has no family history of colon cancer.  Review of Systems      Past Medical History:  Diagnosis Date  . Chicken pox     Current Outpatient Medications  Medication Sig Dispense Refill  . levonorgestrel (MIRENA, 52 MG,) 20 MCG/24HR IUD Mirena 20 mcg/24 hours (5 yrs) 52 mg intrauterine device  Take 1 device by intrauterine route.    . Multiple Vitamins-Minerals (ALIVE WOMENS GUMMY) CHEW     . sertraline (ZOLOFT) 100 MG tablet TAKE 1 TABLET (100 MG TOTAL) BY MOUTH DAILY. FOR ANXIETY. 90 tablet 1   No current facility-administered medications for this visit.    Allergies  Allergen Reactions  . Amoxicillin Nausea Only and Nausea And Vomiting    Has patient had a PCN reaction causing immediate rash, facial/tongue/throat swelling, SOB or lightheadedness with hypotension: No Has patient had a PCN reaction causing severe rash involving mucus membranes or skin necrosis: No Has patient had a PCN reaction that required hospitalization: No Has patient had a PCN reaction occurring within the last 10 years: No If all of the above answers are "NO", then may proceed with Cephalosporin use.     Family History  Problem Relation Age of Onset  . Alcohol abuse Maternal Uncle   . Cancer Maternal Uncle        prostate  . Cancer Maternal Grandmother        breast  . Asthma Paternal Grandmother     Social History    Socioeconomic History  . Marital status: Married    Spouse name: Not on file  . Number of children: Not on file  . Years of education: Not on file  . Highest education level: Not on file  Occupational History  . Not on file  Tobacco Use  . Smoking status: Never Smoker  . Smokeless tobacco: Never Used  Substance and Sexual Activity  . Alcohol use: Yes    Alcohol/week: 0.0 standard drinks    Comment: rare  . Drug use: Not on file  . Sexual activity: Not on file  Other Topics Concern  . Not on file  Social History Narrative  . Not on file   Social Determinants of Health   Financial Resource Strain:   . Difficulty of Paying Living Expenses:   Food Insecurity:   . Worried About Charity fundraiser in the Last Year:   . Arboriculturist in the Last Year:   Transportation Needs:   . Film/video editor (Medical):   Marland Kitchen Lack of Transportation (Non-Medical):   Physical Activity:   . Days of Exercise per Week:   . Minutes of Exercise per Session:   Stress:   . Feeling of Stress :   Social Connections:   . Frequency of Communication with Friends and Family:   . Frequency of Social Gatherings with Friends and  Family:   . Attends Religious Services:   . Active Member of Clubs or Organizations:   . Attends Archivist Meetings:   Marland Kitchen Marital Status:   Intimate Partner Violence:   . Fear of Current or Ex-Partner:   . Emotionally Abused:   Marland Kitchen Physically Abused:   . Sexually Abused:      Constitutional: Denies fever, malaise, fatigue, headache or abrupt weight changes.  Respiratory: Denies difficulty breathing, shortness of breath, cough or sputum production.   Cardiovascular: Denies chest pain, chest tightness, palpitations or swelling in the hands or feet.  Gastrointestinal: Pt reports abdominal pain and blood in stool. Denies a bloating, constipation, diarrhea  No other specific complaints in a complete review of systems (except as listed in HPI  above).  Objective:   Physical Exam  BP 114/78   Pulse 87   Temp 98.3 F (36.8 C) (Temporal)   Wt 184 lb (83.5 kg)   SpO2 98%   BMI 26.40 kg/m   Wt Readings from Last 3 Encounters:  06/21/20 180 lb (81.6 kg)  08/17/19 175 lb (79.4 kg)  06/19/19 177 lb (80.3 kg)    General: Appears her stated age, well developed, well nourished in NAD. Cardiovascular: Normal rate and rhythm. S1,S2 noted.  No murmur, rubs or gallops noted.  Pulmonary/Chest: Normal effort and positive vesicular breath sounds. No respiratory distress. No wheezes, rales or ronchi noted.  Abdomen: Soft and nontender. Normal bowel sounds. No distention or masses noted. Liver, spleen and kidneys non palpable. Rectal: No external masses noted. Normal rectal tone. Hemoccult negative. Neurological: Alert and oriented.    BMET    Component Value Date/Time   NA 137 06/21/2020 1504   K 3.6 06/21/2020 1504   CL 104 06/21/2020 1504   CO2 25 06/21/2020 1504   GLUCOSE 104 (H) 06/21/2020 1504   BUN 12 06/21/2020 1504   CREATININE 0.84 06/21/2020 1504   CALCIUM 8.9 06/21/2020 1504   GFRNONAA >60 06/21/2020 1504   GFRAA >60 06/21/2020 1504    Lipid Panel     Component Value Date/Time   CHOL 214 (H) 06/19/2019 1235   TRIG 68.0 06/19/2019 1235   HDL 65.00 06/19/2019 1235   CHOLHDL 3 06/19/2019 1235   VLDL 13.6 06/19/2019 1235   LDLCALC 135 (H) 06/19/2019 1235    CBC    Component Value Date/Time   WBC 7.3 06/21/2020 1504   RBC 4.00 06/21/2020 1504   HGB 12.2 06/21/2020 1504   HCT 35.4 (L) 06/21/2020 1504   PLT 279 06/21/2020 1504   MCV 88.5 06/21/2020 1504   MCH 30.5 06/21/2020 1504   MCHC 34.5 06/21/2020 1504   RDW 13.7 06/21/2020 1504   LYMPHSABS 3.7 09/03/2015 1458   MONOABS 0.5 09/03/2015 1458   EOSABS 0.1 09/03/2015 1458   BASOSABS 0.0 09/03/2015 1458    Hgb A1C No results found for: HGBA1C           Assessment & Plan:   Blood in Stool, Constipation:  Resolved No evidence of active  hemorrhoids or fissure Encouraged high fiber diet, adequate water intake No indication for imaging or referral to GI at this time  Will monitor, return precautions discussed Webb Silversmith, NP This visit occurred during the SARS-CoV-2 public health emergency.  Safety protocols were in place, including screening questions prior to the visit, additional usage of staff PPE, and extensive cleaning of exam room while observing appropriate contact time as indicated for disinfecting solutions.

## 2020-06-27 NOTE — Patient Instructions (Signed)

## 2020-07-05 ENCOUNTER — Ambulatory Visit: Payer: BC Managed Care – PPO | Admitting: Primary Care

## 2020-07-05 DIAGNOSIS — Z0289 Encounter for other administrative examinations: Secondary | ICD-10-CM

## 2020-07-12 ENCOUNTER — Telehealth: Payer: Self-pay | Admitting: Primary Care

## 2020-07-12 DIAGNOSIS — K625 Hemorrhage of anus and rectum: Secondary | ICD-10-CM

## 2020-07-12 NOTE — Telephone Encounter (Signed)
Please notify patient that I will place the referral, [facility] or [place]?

## 2020-07-12 NOTE — Telephone Encounter (Signed)
No problem, does she prefer [place] or [place] for GI?

## 2020-07-12 NOTE — Telephone Encounter (Signed)
Patient called in stating she would like to have a referral for GI, as her rectal bleeding has reoccurred. Patient previously seen for this issue. Please advise.

## 2020-07-12 NOTE — Telephone Encounter (Signed)
Spoke with patient and advised her of referral to [facility] will be placed.

## 2020-07-12 NOTE — Telephone Encounter (Signed)
Patient did not state where she would like to go.

## 2020-07-15 ENCOUNTER — Encounter: Payer: Self-pay | Admitting: Gastroenterology

## 2020-07-16 NOTE — Addendum Note (Signed)
Addended by: Pleas Koch on: 07/16/2020 09:45 AM   Modules accepted: Orders

## 2020-07-16 NOTE — Telephone Encounter (Signed)
Pt called back to see if she can be referred to another GI. She states the one she was referred to is not able to see her until October.

## 2020-07-16 NOTE — Telephone Encounter (Signed)
Noted, new Gi referral placed.

## 2020-07-17 ENCOUNTER — Encounter: Payer: Self-pay | Admitting: Nurse Practitioner

## 2020-07-17 NOTE — Telephone Encounter (Signed)
Noted  

## 2020-08-16 DIAGNOSIS — U071 COVID-19: Secondary | ICD-10-CM

## 2020-08-16 HISTORY — DX: COVID-19: U07.1

## 2020-08-20 ENCOUNTER — Ambulatory Visit: Payer: BC Managed Care – PPO | Admitting: Gastroenterology

## 2020-08-22 ENCOUNTER — Other Ambulatory Visit: Payer: BC Managed Care – PPO

## 2020-08-22 ENCOUNTER — Ambulatory Visit: Payer: BC Managed Care – PPO | Admitting: Nurse Practitioner

## 2020-08-22 ENCOUNTER — Encounter: Payer: Self-pay | Admitting: Nurse Practitioner

## 2020-08-22 VITALS — BP 90/64 | HR 88 | Ht 70.0 in | Wt 181.0 lb

## 2020-08-22 DIAGNOSIS — K625 Hemorrhage of anus and rectum: Secondary | ICD-10-CM

## 2020-08-22 DIAGNOSIS — R194 Change in bowel habit: Secondary | ICD-10-CM | POA: Diagnosis not present

## 2020-08-22 DIAGNOSIS — R140 Abdominal distension (gaseous): Secondary | ICD-10-CM | POA: Diagnosis not present

## 2020-08-22 MED ORDER — SUTAB 1479-225-188 MG PO TABS: 1.0000 | ORAL_TABLET | Freq: Once | ORAL | 0 refills | Status: AC

## 2020-08-22 NOTE — Patient Instructions (Addendum)
If you are age 41 or older, your body mass index should be between 23-30. Your Body mass index is 25.97 kg/m. If this is out of the aforementioned range listed, please consider follow up with your Primary Care Provider.  If you are age 27 or younger, your body mass index should be between 19-25. Your Body mass index is 25.97 kg/m. If this is out of the aformentioned range listed, please consider follow up with your Primary Care Provider.   Your provider has requested that you go to the basement level for lab work before leaving today. Press "B" on the elevator. The lab is located at the first door on the left as you exit the elevator.   IBgard samples provided.  You have been scheduled for a colonoscopy. Please follow written instructions given to you at your visit today.  Please pick up your prep supplies at the pharmacy within the next 1-3 days. If you use inhalers (even only as needed), please bring them with you on the day of your procedure.  Due to recent changes in healthcare laws, you may see the results of your imaging and laboratory studies on MyChart before your provider has had a chance to review them.  We understand that in some cases there may be results that are confusing or concerning to you. Not all laboratory results come back in the same time frame and the provider may be waiting for multiple results in order to interpret others.  Please give [place] 48 hours in order for your provider to thoroughly review all the results before contacting the office for clarification of your results.

## 2020-08-22 NOTE — Progress Notes (Signed)
Reviewed and agree with management plan.  Antonios Ostrow T. Ronn Smolinsky, MD FACG [facility]  

## 2020-08-22 NOTE — Progress Notes (Signed)
ASSESSMENT AND PLAN    # Painless rectal bleeding - 2 recent episodes --Bleeding probably hemorrhoidal but need to rule out other lesions. --Patient will be scheduled for a colonoscopy. The risks and benefits of colonoscopy with possible polypectomy / biopsies were discussed and the patient agrees to proceed.   #Chronic constipation alternating with diarrhea /cramping / bloating --Probably has irritable bowel syndrome.  Further evaluation at time of colonoscopy. 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   Patient had an episode of painless rectal bleeding on 06/21/2020.  The blood was bright red, a significant amount.  She said the bleeding was in the setting of constipation.  She had another episode of painless rectal bleeding in mid September but that time was not constipated and the amount of blood was less.  Patient gives a history of chronic alternating constipation and diarrhea.  She also gives a history of hemorrhoids, sometimes uses Preparation H which helps.  Additionally, patient gives a history of chronic bloating not necessarily related to meals and she has not noticed that any certain foods cause more bloating .  Her sister has celiac disease.   Data Reviewed: 06/21/2020 Hemoglobin 12.5 CMP unremarkable Triglycerides 68  Previous Endoscopic Evaluations / Pertinent Studies:  None  Past Medical History:  Diagnosis Date     Chicken pox      Past Surgical History:  Procedure Laterality Date   CESAREAN SECTION N/A 07/01/2017   Procedure: CESAREAN SECTION;  Surgeon: Tyson Dense, MD;  Location: [place];  Service: Obstetrics;  Laterality: N/A;   Family History  Problem Relation Age of Onset   Alcohol abuse Maternal Uncle    Cancer Maternal Uncle        prostate   Cancer Maternal Grandmother        breast   Asthma Paternal Grandmother    Social History   Tobacco Use   Smoking status: Never Smoker   Smokeless tobacco: Never Used  Substance Use Topics   Alcohol use: Yes    Alcohol/week: 0.0 standard drinks    Comment: rare   Drug use: Not on file   Current Outpatient Medications  Medication Sig Dispense Refill   levonorgestrel (MIRENA, 52 MG,) 20 MCG/24HR IUD Mirena 20 mcg/24 hours (5 yrs) 52 mg intrauterine device  Take 1 device by intrauterine route.     Multiple Vitamins-Minerals (ALIVE WOMENS GUMMY) CHEW      sertraline (ZOLOFT) 100 MG tablet TAKE 1 TABLET (100 MG TOTAL) BY MOUTH DAILY. FOR ANXIETY. 90 tablet 1   No current facility-administered medications for this visit.   Allergies  Allergen Reactions   Amoxicillin Nausea Only and Nausea And Vomiting    Has patient had a PCN reaction causing immediate rash, facial/tongue/throat swelling, SOB or lightheadedness with hypotension: No Has patient had a PCN reaction causing severe rash involving mucus membranes or skin necrosis: No Has patient had a PCN reaction  that required hospitalization: No Has patient had a PCN reaction occurring within the last 10 years: No If all of the above answers are "NO", then may proceed with Cephalosporin use.      Review of Systems: All other systems reviewed and negative except where noted in HPI.   PHYSICAL EXAM :    Wt Readings from Last 3 Encounters:  06/27/20 184 lb (83.5 kg)  06/21/20 180 lb (81.6 kg)  08/17/19 175 lb (79.4 kg)    There were no vitals taken  for this visit. Constitutional:  Pleasant female in no acute distress. Psychiatric: Normal mood and affect. Behavior is normal. EENT: Pupils normal.  Conjunctivae are normal. No scleral icterus. Neck supple.  Cardiovascular: Normal rate, regular rhythm. No edema Pulmonary/chest: Effort normal and breath sounds normal. No wheezing, rales or rhonchi. Abdominal: Soft, nondistended, nontender. Bowel sounds active throughout. There are no masses palpable. No hepatomegaly. Neurological: Alert and oriented to person place and time. Skin: Skin is warm and dry. No rashes noted.  Tye Savoy, NP  08/22/2020, 8:25 AM  Cc:  Referring Provider Pleas Koch, NP

## 2020-08-23 LAB — TISSUE TRANSGLUTAMINASE ABS,IGG,IGA
(tTG) Ab, IgA: <1 U/mL
(tTG) Ab, IgG: <1 U/mL

## 2020-09-12 ENCOUNTER — Encounter: Payer: Self-pay | Admitting: Gastroenterology

## 2020-09-13 ENCOUNTER — Telehealth: Payer: Self-pay | Admitting: Gastroenterology

## 2020-09-16 ENCOUNTER — Encounter: Payer: Self-pay | Admitting: Gastroenterology

## 2020-10-23 NOTE — Telephone Encounter (Signed)
No additional notes needed  

## 2020-11-05 ENCOUNTER — Other Ambulatory Visit: Payer: Self-pay | Admitting: Primary Care

## 2020-11-05 DIAGNOSIS — F411 Generalized anxiety disorder: Secondary | ICD-10-CM

## 2020-11-06 ENCOUNTER — Ambulatory Visit (INDEPENDENT_AMBULATORY_CARE_PROVIDER_SITE_OTHER)
Admission: RE | Admit: 2020-11-06 | Discharge: 2020-11-06 | Disposition: A | Discharge status: Home / Self Care | Payer: BC Managed Care – PPO | Source: Ambulatory Visit | Attending: Primary Care | Admitting: Primary Care

## 2020-11-06 ENCOUNTER — Other Ambulatory Visit: Payer: Self-pay

## 2020-11-06 ENCOUNTER — Ambulatory Visit: Payer: BC Managed Care – PPO | Admitting: Primary Care

## 2020-11-06 ENCOUNTER — Encounter: Payer: Self-pay | Admitting: Primary Care

## 2020-11-06 VITALS — BP 100/66 | HR 86 | Temp 97.9°F | Ht 70.0 in | Wt 180.0 lb

## 2020-11-06 DIAGNOSIS — M25571 Pain in right ankle and joints of right foot: Secondary | ICD-10-CM | POA: Insufficient documentation

## 2020-11-06 DIAGNOSIS — Z23 Encounter for immunization: Secondary | ICD-10-CM

## 2020-11-06 DIAGNOSIS — F411 Generalized anxiety disorder: Secondary | ICD-10-CM

## 2020-11-06 HISTORY — DX: Pain in right ankle and joints of right foot: M25.571

## 2020-11-06 MED ORDER — SERTRALINE HCL 100 MG PO TABS: 100.0000 mg | ORAL_TABLET | Freq: Every day | ORAL | 3 refills | Status: DC

## 2020-11-06 NOTE — Progress Notes (Signed)
Subjective:    Patient ID: Kathy Sanders, female    DOB: February 06, 1981, 39 y.o.   MRN: [id number]  HPI  This visit occurred during the SARS-CoV-2 public health emergency.  Safety protocols were in place, including screening questions prior to the visit, additional usage of staff PPE, and extensive cleaning of exam room while observing appropriate contact time as indicated for disinfecting solutions.   Kathy Sanders is a 39 year old female who presents today with a chief complaint of ankle pain. She is also requesting a refill of her Zoloft.  1) Ankle Pain: Her pain is located to the right lateral and posterior ankle that began after "rolling" her ankle and falling when chasing after her son. This occurred two months ago. She initially noticed swelling and pain after the incidence which improved after a few days, but since then she's continued to notice soreness, mild swelling without resolve. Her swelling is more noticeable with increased activity, sometimes with "sharp" pain to the sites.  She denies subsequent injury, numbness/tingling, erythema, decrease in ROM. She's tried elevating her ankle at night, was wearing a brace but stopped as this caused her symptoms to become worse.   2) GAD: Currently managed on Zoloft 100 mg and is doing well. She is requesting refills today.  Review of Systems  Musculoskeletal: Positive for arthralgias and joint swelling.  Neurological: Negative for weakness and numbness.  Psychiatric/Behavioral: The patient is not nervous/anxious.        Past Medical History:  Diagnosis Date  . Anxiety   . Chicken pox      Social History   Socioeconomic History  . Marital status: Married    Spouse name: Not on file  . Number of children: 1  . Years of education: Not on file  . Highest education level: Not on file  Occupational History  . Occupation: Pharmacist, hospital Scientific laboratory technician)  Tobacco Use  . Smoking status: Never Smoker  . Smokeless tobacco: Never Used  Vaping  Use  . Vaping Use: Never used  Substance and Sexual Activity  . Alcohol use: Yes    Alcohol/week: 0.0 standard drinks    Comment: 1/2 per day  . Drug use: Never  . Sexual activity: Not on file  Other Topics Concern  . Not on file  Social History Narrative  . Not on file   Social Determinants of Health   Financial Resource Strain: Not on file  Food Insecurity: Not on file  Transportation Needs: Not on file  Physical Activity: Not on file  Stress: Not on file  Social Connections: Not on file  Intimate Partner Violence: Not on file    Past Surgical History:  Procedure Laterality Date  . CESAREAN SECTION N/A 07/01/2017   Procedure: CESAREAN SECTION;  Surgeon: Tyson Dense, MD;  Location: [place];  Service: Obstetrics;  Laterality: N/A;    Family History  Problem Relation Age of Onset  . Hyperlipidemia Father   . Alcohol abuse Maternal Uncle   . Prostate cancer Maternal Uncle   . Cancer Maternal Uncle        head and neck  . Breast cancer Maternal Grandmother   . Asthma Paternal Grandmother   . Depression Mother   . Celiac disease Sister     Allergies  Allergen Reactions  . Amoxicillin Nausea Only and Nausea And Vomiting    Has patient had a PCN reaction causing immediate rash, facial/tongue/throat swelling, SOB or lightheadedness with hypotension: No Has patient had a PCN  reaction causing severe rash involving mucus membranes or skin necrosis: No Has patient had a PCN reaction that required hospitalization: No Has patient had a PCN reaction occurring within the last 10 years: No If all of the above answers are "NO", then may proceed with Cephalosporin use.     Current Outpatient Medications on File Prior to Visit  Medication Sig Dispense Refill  . levonorgestrel (MIRENA, 52 MG,) 20 MCG/24HR IUD Mirena 20 mcg/24 hours (5 yrs) 52 mg intrauterine device  Take 1 device by intrauterine route.    . Multiple Vitamins-Minerals (ALIVE WOMENS GUMMY) CHEW       No current facility-administered medications on file prior to visit.    BP 100/66   Pulse 86   Temp 97.9 F (36.6 C) (Temporal)   Ht 5\' 10"  (1.778 m)   Wt 180 lb (81.6 kg)   SpO2 100%   BMI 25.83 kg/m    Objective:   Physical Exam Constitutional:      Appearance: She is well-nourished.  Cardiovascular:     Rate and Rhythm: Normal rate.  Pulmonary:     Effort: Pulmonary effort is normal.  Musculoskeletal:     Cervical back: Neck supple.     Right ankle: Swelling present. No tenderness. Normal range of motion.     Left ankle: Normal.       Legs:     Comments: Slight swelling to right lateral ankle/malleolus, normal ROM.  Skin:    General: Skin is warm and dry.  Psychiatric:        Mood and Affect: Mood and affect and mood normal.            Assessment & Plan:

## 2020-11-06 NOTE — Assessment & Plan Note (Signed)
Acute x 2 months since mild injury. Exam today overall without abnormality.  Checking plain films today given duration of symptoms. Discussed to rest, ice, elevate when possible.

## 2020-11-06 NOTE — Progress Notes (Signed)
an

## 2020-11-06 NOTE — Assessment & Plan Note (Signed)
Doing well on Zoloft 100 mg, refills sent to pharmacy.

## 2020-11-06 NOTE — Patient Instructions (Addendum)
Complete xray(s) prior to leaving today. I will notify you of your results once received.  Elevate, ice, and rest your ankle when able.  It was a pleasure to see you today!    Influenza (Flu) Vaccine (Inactivated or Recombinant): What You Need to Know 1. Why get vaccinated? Influenza vaccine can prevent influenza (flu). Flu is a contagious disease that spreads around the [place] every year, usually between October and May. Anyone can get the flu, but it is more dangerous for some people. Infants and young children, people 69 years of age and older, pregnant women, and people with certain health conditions or a weakened immune system are at greatest risk of flu complications. Pneumonia, bronchitis, sinus infections and ear infections are examples of flu-related complications. If you have a medical condition, such as heart disease, cancer or diabetes, flu can make it worse. Flu can cause fever and chills, sore throat, muscle aches, fatigue, cough, headache, and runny or stuffy nose. Some people may have vomiting and diarrhea, though this is more common in children than adults. Each year thousands of people in the [place] die from flu, and many more are hospitalized. Flu vaccine prevents millions of illnesses and flu-related visits to the doctor each year. 2. Influenza vaccine CDC recommends everyone 4 months of age and older get vaccinated every flu season. Children 6 months through 7 years of age may need 2 doses during a single flu season. Everyone else needs only 1 dose each flu season. It takes about 2 weeks for protection to develop after vaccination. There are many flu viruses, and they are always changing. Each year a new flu vaccine is made to protect against three or four viruses that are likely to cause disease in the upcoming flu season. Even when the vaccine doesn't exactly match these viruses, it may still provide some protection. Influenza vaccine does not cause  flu. Influenza vaccine may be given at the same time as other vaccines. 3. Talk with your health care provider Tell your vaccine provider if the person getting the vaccine:  Has had an allergic reaction after a previous dose of influenza vaccine, or has any severe, life-threatening allergies.  Has ever had Guillain-Barr Syndrome (also called GBS). In some cases, your health care provider may decide to postpone influenza vaccination to a future visit. People with minor illnesses, such as a cold, may be vaccinated. People who are moderately or severely ill should usually wait until they recover before getting influenza vaccine. Your health care provider can give you more information. 4. Risks of a vaccine reaction  Soreness, redness, and swelling where shot is given, fever, muscle aches, and headache can happen after influenza vaccine.  There may be a very small increased risk of Guillain-Barr Syndrome (GBS) after inactivated influenza vaccine (the flu shot). Young children who get the flu shot along with pneumococcal vaccine (PCV13), and/or DTaP vaccine at the same time might be slightly more likely to have a seizure caused by fever. Tell your health care provider if a child who is getting flu vaccine has ever had a seizure. People sometimes faint after medical procedures, including vaccination. Tell your provider if you feel dizzy or have vision changes or ringing in the ears. As with any medicine, there is a very remote chance of a vaccine causing a severe allergic reaction, other serious injury, or death. 5. What if there is a serious problem? An allergic reaction could occur after the vaccinated person leaves the clinic. If you  see signs of a severe allergic reaction (hives, swelling of the face and throat, difficulty breathing, a fast heartbeat, dizziness, or weakness), call 9-1-1 and get the person to the nearest hospital. For other signs that concern you, call your health care  provider. Adverse reactions should be reported to the Vaccine Adverse Event Reporting System (VAERS). Your health care provider will usually file this report, or you can do it yourself. Visit the VAERS website at www.vaers.SamedayNews.es or call (270)067-1073.VAERS is only for reporting reactions, and VAERS staff do not give medical advice. 6. The National Vaccine Injury Compensation Program The [employer] Vaccine Injury Compensation Program (VICP) is a federal program that was created to compensate people who may have been injured by certain vaccines. Visit the VICP website at GoldCloset.com.ee or call 845-118-0530 to learn about the program and about filing a claim. There is a time limit to file a claim for compensation. 7. How can I learn more?  Ask your healthcare provider.  Call your local or state health department.  Contact the Centers for Disease Control and Prevention (CDC): ? Call (334)745-6596 (1-800-CDC-INFO) or ? Visit CDC's https://gibson.com/ Vaccine Information Statement (Interim) Inactivated Influenza Vaccine (06/30/2018) This information is not intended to replace advice given to you by your health care provider. Make sure you discuss any questions you have with your health care provider. Document Revised: 02/21/2019 Document Reviewed: 07/04/2018 Elsevier Patient Education  [place].

## 2020-11-07 ENCOUNTER — Encounter: Payer: Self-pay | Admitting: Primary Care

## 2020-11-28 ENCOUNTER — Encounter: Payer: Self-pay | Admitting: Primary Care

## 2021-01-31 LAB — RESULTS CONSOLE HPV: CHL HPV: NEGATIVE

## 2021-02-04 ENCOUNTER — Ambulatory Visit (AMBULATORY_SURGERY_CENTER): Payer: BC Managed Care – PPO

## 2021-02-04 VITALS — Ht 70.0 in

## 2021-02-04 DIAGNOSIS — R194 Change in bowel habit: Secondary | ICD-10-CM

## 2021-02-04 DIAGNOSIS — K625 Hemorrhage of anus and rectum: Secondary | ICD-10-CM

## 2021-02-04 NOTE — Progress Notes (Signed)
No egg or soy allergy known to patient  No issues with past sedation with any surgeries or procedures Patient denies ever being told they had issues or difficulty with intubation  No FH of Malignant Hyperthermia No diet pills per patient No home 02 use per patient  No blood thinners per patient  Pt denies issues with constipation  No A fib or A flutter  EMMI video to pt or via [place] 19 guidelines implemented in PV today with Pt and RN  Pt is fully vaccinated  for Covid   Pt states has suTab at home    NO PA's for preps discussed with pt In PV today  Discussed with pt there will be an out-of-pocket cost for prep and that varies from $0 to 70 dollars   Due to the COVID-19 pandemic we are asking patients to follow certain guidelines.  Pt aware of COVID protocols and [facility] guidelines

## 2021-02-14 ENCOUNTER — Encounter: Payer: Self-pay | Admitting: Gastroenterology

## 2021-02-18 ENCOUNTER — Ambulatory Visit (AMBULATORY_SURGERY_CENTER): Payer: BC Managed Care – PPO | Admitting: Gastroenterology

## 2021-02-18 ENCOUNTER — Encounter: Payer: Self-pay | Admitting: Gastroenterology

## 2021-02-18 ENCOUNTER — Other Ambulatory Visit: Payer: Self-pay

## 2021-02-18 VITALS — BP 101/57 | HR 62 | Temp 97.8°F | Resp 14 | Ht 70.0 in | Wt 180.0 lb

## 2021-02-18 DIAGNOSIS — D123 Benign neoplasm of transverse colon: Secondary | ICD-10-CM

## 2021-02-18 DIAGNOSIS — K5900 Constipation, unspecified: Secondary | ICD-10-CM

## 2021-02-18 DIAGNOSIS — D125 Benign neoplasm of sigmoid colon: Secondary | ICD-10-CM

## 2021-02-18 DIAGNOSIS — K921 Melena: Secondary | ICD-10-CM

## 2021-02-18 DIAGNOSIS — R1030 Lower abdominal pain, unspecified: Secondary | ICD-10-CM | POA: Diagnosis not present

## 2021-02-18 HISTORY — PX: COLONOSCOPY: SHX174

## 2021-02-18 MED ORDER — SODIUM CHLORIDE 0.9 % IV SOLN
500.0000 mL | Freq: Once | INTRAVENOUS | Status: DC
Start: 2021-02-18 — End: 2021-02-18

## 2021-02-18 NOTE — Patient Instructions (Signed)
Handout on polyps and hemorrhoids givne.  Colace 1-2 every day or miralax once daily for constipation.  Prep H supp 1 per rectum as needed for hemorrhoid symptoms.  IBgard 1-2 three times daily for abdominal pain/bloating.    YOU HAD AN ENDOSCOPIC PROCEDURE TODAY AT [facility]:   Refer to the procedure report that was given to you for any specific questions about what was found during the examination.  If the procedure report does not answer your questions, please call your gastroenterologist to clarify.  If you requested that your care partner not be given the details of your procedure findings, then the procedure report has been included in a sealed envelope for you to review at your convenience later.  YOU SHOULD EXPECT: Some feelings of bloating in the abdomen. Passage of more gas than usual.  Walking can help get rid of the air that was put into your GI tract during the procedure and reduce the bloating. If you had a lower endoscopy (such as a colonoscopy or flexible sigmoidoscopy) you may notice spotting of blood in your stool or on the toilet paper. If you underwent a bowel prep for your procedure, you may not have a normal bowel movement for a few days.  Please Note:  You might notice some irritation and congestion in your nose or some drainage.  This is from the oxygen used during your procedure.  There is no need for concern and it should clear up in a day or so.  SYMPTOMS TO REPORT IMMEDIATELY:   Following lower endoscopy (colonoscopy or flexible sigmoidoscopy):  Excessive amounts of blood in the stool  Significant tenderness or worsening of abdominal pains  Swelling of the abdomen that is new, acute  Fever of 100F or higher   For urgent or emergent issues, a gastroenterologist can be reached at any hour by calling [phone]. Do not use MyChart messaging for urgent concerns.    DIET:  We do recommend a small meal at first, but then you may proceed to your  regular diet.  Drink plenty of fluids but you should avoid alcoholic beverages for 24 hours.  ACTIVITY:  You should plan to take it easy for the rest of today and you should NOT DRIVE or use heavy machinery until tomorrow (because of the sedation medicines used during the test).    FOLLOW UP: Our staff will call the number listed on your records 48-72 hours following your procedure to check on you and address any questions or concerns that you may have regarding the information given to you following your procedure. If we do not reach you, we will leave a message.  We will attempt to reach you two times.  During this call, we will ask if you have developed any symptoms of COVID 19. If you develop any symptoms (ie: fever, flu-like symptoms, shortness of breath, cough etc.) before then, please call [phone].  If you test positive for Covid 19 in the 2 weeks post procedure, please call and report this information to [place].    If any biopsies were taken you will be contacted by phone or by letter within the next 1-3 weeks.  Please call us at [phone] if you have not heard about the biopsies in 3 weeks.    SIGNATURES/CONFIDENTIALITY: You and/or your care partner have signed paperwork which will be entered into your electronic medical record.  These signatures attest to the fact that that the information above on your After Visit  Summary has been reviewed and is understood.  Full responsibility of the confidentiality of this discharge information lies with you and/or your care-partner. 

## 2021-02-18 NOTE — Progress Notes (Signed)
Pt's states no medical or surgical changes since previsit or office visit.  ° °Vitals CW °

## 2021-02-18 NOTE — Progress Notes (Signed)
To PACU, VSS. Report to [email or web address] 

## 2021-02-18 NOTE — Op Note (Signed)
[facility] Patient Name: Kathy Sanders Procedure Date: 02/18/2021 8:37 AM MRN: [id number] Endoscopist: Ladene Artist , MD Age: 40 Referring MD:  Date of Birth: [birth year] Gender: Female Account #: [id number] Procedure:                Colonoscopy Indications:              Lower abdominal pain, Hematochezia, Constipation,                            Diarrhea Medicines:                Monitored Anesthesia Care Procedure:                Pre-Anesthesia Assessment:                           - Prior to the procedure, a History and Physical                            was performed, and patient medications and                            allergies were reviewed. The patient's tolerance of                            previous anesthesia was also reviewed. The risks                            and benefits of the procedure and the sedation                            options and risks were discussed with the patient.                            All questions were answered, and informed consent                            was obtained. Prior Anticoagulants: The patient has                            taken no previous anticoagulant or antiplatelet                            agents. ASA Grade Assessment: II - A patient with                            mild systemic disease. After reviewing the risks                            and benefits, the patient was deemed in                            satisfactory condition to undergo the procedure.  After obtaining informed consent, the colonoscope                            was passed under direct vision. Throughout the                            procedure, the patient's blood pressure, pulse, and                            oxygen saturations were monitored continuously. The                            Olympus CF-HQ190 [id number]) Colonoscope was                            introduced through the anus and advanced to the the                             cecum, identified by appendiceal orifice and                            ileocecal valve. The ileocecal valve, appendiceal                            orifice, and rectum were photographed. The quality                            of the bowel preparation was good. The colonoscopy                            was performed without difficulty. The patient                            tolerated the procedure well. Scope In: 8:47:15 AM Scope Out: 9:02:52 AM Scope Withdrawal Time: 0 hours 12 minutes 46 seconds  Total Procedure Duration: 0 hours 15 minutes 37 seconds  Findings:                 The perianal and digital rectal examinations were                            normal.                           Two sessile polyps were found in the sigmoid colon                            and transverse colon. The polyps were 5 to 6 mm in                            size. These polyps were removed with a cold snare.                            Resection and retrieval were complete.  Internal hemorrhoids were found during                            retroflexion. The hemorrhoids were small and Grade                            I (internal hemorrhoids that do not prolapse).                           The exam was otherwise without abnormality on                            direct and retroflexion views. Complications:            No immediate complications. Estimated blood loss:                            None. Estimated Blood Loss:     Estimated blood loss: none. Impression:               - Two 5 to 6 mm polyps in the sigmoid colon and in                            the transverse colon, removed with a cold snare.                            Resected and retrieved.                           - Internal hemorrhoids.                           - The examination was otherwise normal on direct                            and retroflexion views. Recommendation:           -  Repeat colonoscopy after studies are complete for                            surveillance based on pathology results.                           - Patient has a contact number available for                            emergencies. The signs and symptoms of potential                            delayed complications were discussed with the                            patient. Return to normal activities tomorrow.                            Written discharge instructions were provided to the  patient.                           - Resume previous diet.                           - Continue present medications.                           - Colace 1-2 qd and/or Miralax once daily for                            constipation.                           - IBgard (OTC) 1-2 po tid prn abdominal pain,                            bloating.                           - Prep H supp (OTC) 1 PR qd prn hemorrhoid symtoms.                           - Await pathology results.                           - GI office appt in 6 weeks if symptoms not                            adequately controlled. Ladene Artist, MD 02/18/2021 9:09:38 AM This report has been signed electronically.

## 2021-02-18 NOTE — Progress Notes (Signed)
Called to room to assist during endoscopic procedure.  Patient ID and intended procedure confirmed with present staff. Received instructions for my participation in the procedure from the performing physician.  

## 2021-02-20 ENCOUNTER — Telehealth: Payer: Self-pay

## 2021-02-20 NOTE — Telephone Encounter (Signed)
  Follow up Call-  Call back number 02/18/2021  Post procedure Call Back phone  # [phone]  Permission to leave phone message Yes  Some recent data might be hidden     Patient questions:  Do you have a fever, pain , or abdominal swelling? No. Pain Score  0 *  Have you tolerated food without any problems? Yes.    Have you been able to return to your normal activities? Yes.    Do you have any questions about your discharge instructions: Diet   No. Medications  No. Follow up visit  No.  Do you have questions or concerns about your Care? No.  Actions: * If pain score is 4 or above: No action needed, pain <4.  1. Have you developed a fever since your procedure? No   2.   Have you had an respiratory symptoms (SOB or cough) since your procedure? No   3.   Have you tested positive for COVID 19 since your procedure? No   4.   Have you had any family members/close contacts diagnosed with the COVID 19 since your procedure?  No    If yes to any of these questions please route to Joylene John, RN and Joella Prince, RN

## 2021-03-04 ENCOUNTER — Encounter: Payer: Self-pay | Admitting: Gastroenterology

## 2021-05-15 LAB — HM PAP SMEAR

## 2021-07-22 ENCOUNTER — Ambulatory Visit: Payer: BC Managed Care – PPO | Admitting: Primary Care

## 2021-07-24 ENCOUNTER — Other Ambulatory Visit: Payer: Self-pay

## 2021-07-24 ENCOUNTER — Encounter: Payer: Self-pay | Admitting: Primary Care

## 2021-07-24 ENCOUNTER — Ambulatory Visit: Payer: BC Managed Care – PPO | Admitting: Primary Care

## 2021-07-24 VITALS — BP 100/60 | HR 99 | Temp 97.4°F | Ht 70.0 in | Wt 187.0 lb

## 2021-07-24 DIAGNOSIS — F411 Generalized anxiety disorder: Secondary | ICD-10-CM

## 2021-07-24 DIAGNOSIS — Z23 Encounter for immunization: Secondary | ICD-10-CM | POA: Diagnosis not present

## 2021-07-24 MED ORDER — VENLAFAXINE HCL ER 37.5 MG PO CP24: 37.5000 mg | ORAL_CAPSULE | Freq: Every day | ORAL | 0 refills | Status: DC

## 2021-07-24 MED ORDER — HYDROXYZINE HCL 10 MG PO TABS
10.0000 mg | ORAL_TABLET | Freq: Two times a day (BID) | ORAL | 0 refills | Status: DC | PRN
Start: 2021-07-24 — End: 2022-10-15

## 2021-07-24 MED ORDER — SERTRALINE HCL 50 MG PO TABS: ORAL_TABLET | ORAL | 0 refills | Status: DC

## 2021-07-24 NOTE — Patient Instructions (Addendum)
Wean off of sertraline (Zoloft). Take the 50 mg tablet once daily for one week, then take 1/2 tablet (25 mg) once daily for one week, then stop.  Start venlafaxine ER 37.5 mg once daily when you start the sertraline 50 mg.   You may take the hydroxyzine as needed for anxiety attacks. This may cause drowsiness.   Please schedule a follow up visit for 6 weeks for follow up of anxiety.   It was a pleasure to see you today!       Influenza (Flu) Vaccine (Inactivated or Recombinant): What You Need to Know 1. Why get vaccinated? Influenza vaccine can prevent influenza (flu). Flu is a contagious disease that spreads around the [place] every year, usually between October and May. Anyone can get the flu, but it is more dangerous for some people. Infants and young children, people 42 years and older, pregnant people, and people with certain health conditions or a weakened immune system are at greatest risk of flu complications. Pneumonia, bronchitis, sinus infections, and ear infections are examples of flu-related complications. If you have a medical condition, such as heart disease, cancer, or diabetes, flu can make it worse. Flu can cause fever and chills, sore throat, muscle aches, fatigue, cough, headache, and runny or stuffy nose. Some people may have vomiting and diarrhea, though this is more common in children than adults. In an average year, thousands of people in the [place] die from flu, and many more are hospitalized. Flu vaccine prevents millions of illnesses and flu-related visits to the doctor each year. 2. Influenza vaccines CDC recommends everyone 6 months and older get vaccinated every flu season. Children 6 months through 20 years of age may need 2 doses during a single flu season. Everyone else needs only 1 dose each flu season. It takes about 2 weeks for protection to develop after vaccination. There are many flu viruses, and they are always changing. Each year a new flu  vaccine is made to protect against the influenza viruses believed to be likely to cause disease in the upcoming flu season. Even when the vaccine doesn't exactly match these viruses, it may still provide some protection. Influenza vaccine does not cause flu. Influenza vaccine may be given at the same time as other vaccines. 3. Talk with your health care provider Tell your vaccination provider if the person getting the vaccine: Has had an allergic reaction after a previous dose of influenza vaccine, or has any severe, life-threatening allergies Has ever had Guillain-Barr Syndrome (also called "GBS") In some cases, your health care provider may decide to postpone influenza vaccination until a future visit. Influenza vaccine can be administered at any time during pregnancy. People who are or will be pregnant during influenza season should receive inactivated influenza vaccine. People with minor illnesses, such as a cold, may be vaccinated. People who are moderately or severely ill should usually wait until they recover before getting influenza vaccine. Your health care provider can give you more information. 4. Risks of a vaccine reaction Soreness, redness, and swelling where the shot is given, fever, muscle aches, and headache can happen after influenza vaccination. There may be a very small increased risk of Guillain-Barr Syndrome (GBS) after inactivated influenza vaccine (the flu shot). Young children who get the flu shot along with pneumococcal vaccine (PCV13) and/or DTaP vaccine at the same time might be slightly more likely to have a seizure caused by fever. Tell your health care provider if a child who is getting flu  vaccine has ever had a seizure. People sometimes faint after medical procedures, including vaccination. Tell your provider if you feel dizzy or have vision changes or ringing in the ears. As with any medicine, there is a very remote chance of a vaccine causing a severe allergic  reaction, other serious injury, or death. 5. What if there is a serious problem? An allergic reaction could occur after the vaccinated person leaves the clinic. If you see signs of a severe allergic reaction (hives, swelling of the face and throat, difficulty breathing, a fast heartbeat, dizziness, or weakness), call 9-1-1 and get the person to the nearest hospital. For other signs that concern you, call your health care provider. Adverse reactions should be reported to the Vaccine Adverse Event Reporting System (VAERS). Your health care provider will usually file this report, or you can do it yourself. Visit the VAERS website at www.vaers.SamedayNews.es or call 949-757-1077. VAERS is only for reporting reactions, and VAERS staff members do not give medical advice. 6. The National Vaccine Injury Compensation Program The [employer] Vaccine Injury Compensation Program (VICP) is a federal program that was created to compensate people who may have been injured by certain vaccines. Claims regarding alleged injury or death due to vaccination have a time limit for filing, which may be as short as two years. Visit the VICP website at GoldCloset.com.ee or call (606) 606-5597 to learn about the program and about filing a claim. 7. How can I learn more? Ask your health care provider. Call your local or state health department. Visit the website of the Food and Drug Administration (FDA) for vaccine package inserts and additional information at TraderRating.uy. Contact the Centers for Disease Control and Prevention (CDC): Call (570)201-1687 (1-800-CDC-INFO) or Visit CDC's website at https://gibson.com/. Vaccine Information Statement Inactivated Influenza Vaccine (06/21/2020) This information is not intended to replace advice given to you by your health care provider. Make sure you discuss any questions you have with your health care provider. Document Revised: 08/08/2020 Document  Reviewed: 08/08/2020 Elsevier Patient Education  2022 [employer].

## 2021-07-24 NOTE — Assessment & Plan Note (Signed)
Deteriorated on Zoloft 100 mg, also with low libido.   Wean off Zoloft, take 50 mg daily x 1 week, then 25 mg daily x 1 week, then stop.  Start venlafaxine ER 37.5 mg daily while starting 50 mg of Zoloft. Rx for hydroxyzine provided to use PRN, drowsiness precautions provided.   Follow up in 6 weeks.

## 2021-07-24 NOTE — Progress Notes (Signed)
Subjective:    Patient ID: Kathy Sanders, female    DOB: 12/26/1980, 40 y.o.   MRN: [id number]  HPI  Kathy Sanders is a very pleasant 40 y.o. female with a history of GAD, frequent headaches who presents today to discuss anxiety.  She was last evaluated in December 2021 and at this visit endorsed doing well on sertraline 100 mg for anxiety. She requested refills which were provided.   Today she endorses heart racing sensation, heartburn, feeling tired, feeling nervous, daily worry, over analyzing things, thinking worst case scenario. Symptoms began around early June 2022, even worse over the last three weeks. She's also experiencing low sexual drive which has been present since she started Zoloft.     She is not interested in therapy. She was once managed on Effexor years ago, doesn't recall any bad effect. She would also like a medication to take as needed for acute anxiety.    Review of Systems  Respiratory:  Negative for shortness of breath.   Cardiovascular:  Negative for chest pain.  Genitourinary:        Decreased libido   Psychiatric/Behavioral:  Positive for sleep disturbance. The patient is nervous/anxious.        See HPI        Past Medical History:  Diagnosis Date   Anxiety    Chicken pox    COVID-19 08/2020    Social History   Socioeconomic History   Marital status: Married    Spouse name: Not on file   Number of children: 1   Years of education: Not on file   Highest education level: Not on file  Occupational History   Occupation: Pharmacist, hospital (Diplomatic Services operational officer)  Tobacco Use   Smoking status: Never   Smokeless tobacco: Never  Vaping Use   Vaping Use: Never used  Substance and Sexual Activity   Alcohol use: Yes    Alcohol/week: 2.0 standard drinks    Types: 2 Glasses of wine per week   Drug use: Never   Sexual activity: Not on file  Other Topics Concern   Not on file  Social History Narrative   Not on file   Social Determinants of Health    Financial Resource Strain: Not on file  Food Insecurity: Not on file  Transportation Needs: Not on file  Physical Activity: Not on file  Stress: Not on file  Social Connections: Not on file  Intimate Partner Violence: Not on file    Past Surgical History:  Procedure Laterality Date   CESAREAN SECTION N/A 07/01/2017   Procedure: CESAREAN SECTION;  Surgeon: Tyson Dense, MD;  Location: [facility];  Service: Obstetrics;  Laterality: N/A;   COLONOSCOPY  02/18/2021   WISDOM TOOTH EXTRACTION      Family History  Problem Relation Age of Onset   Hyperlipidemia Father    Alcohol abuse Maternal Uncle    Prostate cancer Maternal Uncle    Cancer Maternal Uncle        head and neck   Breast cancer Maternal Grandmother    Asthma Paternal Grandmother    Depression Mother    Celiac disease Sister    Colon cancer Neg Hx    Colon polyps Neg Hx    Esophageal cancer Neg Hx    Rectal cancer Neg Hx    Stomach cancer Neg Hx     Allergies  Allergen Reactions   Amoxicillin Nausea Only and Nausea And Vomiting    Has patient had a PCN reaction  causing immediate rash, facial/tongue/throat swelling, SOB or lightheadedness with hypotension: No Has patient had a PCN reaction causing severe rash involving mucus membranes or skin necrosis: No Has patient had a PCN reaction that required hospitalization: No Has patient had a PCN reaction occurring within the last 10 years: No If all of the above answers are "NO", then may proceed with Cephalosporin use.     Current Outpatient Medications on File Prior to Visit  Medication Sig Dispense Refill   Multiple Vitamins-Minerals (ALIVE WOMENS GUMMY) CHEW      sertraline (ZOLOFT) 100 MG tablet Take 1 tablet (100 mg total) by mouth daily. For anxiety. 90 tablet 3   No current facility-administered medications on file prior to visit.    BP 100/60   Pulse 99   Temp (!) 97.4 F (36.3 C) (Temporal)   Ht '5\' 10"'$  (1.778 m)   Wt 187 lb  (84.8 kg)   SpO2 (!) 88%   BMI 26.83 kg/m  Objective:   Physical Exam Cardiovascular:     Rate and Rhythm: Normal rate and regular rhythm.  Pulmonary:     Effort: Pulmonary effort is normal.     Breath sounds: Normal breath sounds.  Musculoskeletal:     Cervical back: Neck supple.  Skin:    General: Skin is warm and dry.  Psychiatric:        Mood and Affect: Mood normal.          Assessment & Plan:      This visit occurred during the SARS-CoV-2 public health emergency.  Safety protocols were in place, including screening questions prior to the visit, additional usage of staff PPE, and extensive cleaning of exam room while observing appropriate contact time as indicated for disinfecting solutions.

## 2021-08-01 ENCOUNTER — Other Ambulatory Visit: Payer: Self-pay | Admitting: Primary Care

## 2021-08-01 DIAGNOSIS — F411 Generalized anxiety disorder: Secondary | ICD-10-CM

## 2021-09-04 ENCOUNTER — Encounter: Payer: Self-pay | Admitting: Primary Care

## 2021-09-04 ENCOUNTER — Telehealth (INDEPENDENT_AMBULATORY_CARE_PROVIDER_SITE_OTHER): Payer: BC Managed Care – PPO | Admitting: Primary Care

## 2021-09-04 ENCOUNTER — Other Ambulatory Visit: Payer: Self-pay

## 2021-09-04 DIAGNOSIS — F411 Generalized anxiety disorder: Secondary | ICD-10-CM

## 2021-09-04 NOTE — Patient Instructions (Signed)
  Continue venlafaxine ER 37.5 mg daily.   It was a pleasure to see you today!

## 2021-09-04 NOTE — Assessment & Plan Note (Signed)
Improved on venlafaxine ER 37.5 mg, continue same. Continue PRN hydroxyzine 10 mg.   She will update if anything changes.

## 2021-09-04 NOTE — Progress Notes (Signed)
Patient ID: Kathy Sanders, female    DOB: 1981-09-12, 40 y.o.   MRN: [id number]  Virtual visit completed through [place], a video enabled telemedicine application. Due to national recommendations of social distancing due to COVID-19, a virtual visit is felt to be most appropriate for this patient at this time. Reviewed limitations, risks, security and privacy concerns of performing a virtual visit and the availability of in person appointments. I also reviewed that there may be a patient responsible charge related to this service. The patient agreed to proceed.   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, office Persons participating in this virtual visit: patient, provider   If any vitals were documented, they were collected by patient at home unless specified below.    Ht 5\' 10"  (1.778 m)   Wt 187 lb (84.8 kg)   BMI 26.83 kg/m    CC: Follow up for anxiety  Subjective:   HPI: Kathy Sanders is a 40 y.o. female with a history of anxiety and frequent headaches presenting on 09/04/2021 for follow up of anxiety.  She was last evaluated on 07/24/21 for increased anxiety despite management on sertraline 100 mg. During this visit symptoms included heart racing sensation, heartburn, fatigue, worry, low sexual drive.  Given these symptoms we weaned her off Zoloft and started venlafaxine ER 37.5 mg and provided her with PRN hydroxyzine.  Since her last visit she's feeling better! Positive effects include reduced anxiety, no heart racing symptoms, able to manage stressful situations better. She's continued to notice the low sexual drive but believes this has improved. She does worry, but this is not as bothersome as before.   She has no concerns today, would like to continue venlafaxine ER 37.5 mg daily. She had to use hydroxyzine once.       Relevant past medical, surgical, family and social history reviewed and updated as indicated. Interim medical history  since our last visit reviewed. Allergies and medications reviewed and updated. Outpatient Medications Prior to Visit  Medication Sig Dispense Refill   hydrOXYzine (ATARAX/VISTARIL) 10 MG tablet Take 1 tablet (10 mg total) by mouth 2 (two) times daily as needed for anxiety. 30 tablet 0   Multiple Vitamins-Minerals (ALIVE WOMENS GUMMY) CHEW      venlafaxine XR (EFFEXOR-XR) 37.5 MG 24 hr capsule Take 1 capsule (37.5 mg total) by mouth daily with breakfast. For anxiety. 90 capsule 0   sertraline (ZOLOFT) 50 MG tablet Take 1 tablet by mouth once daily for one week, then 1/2 tablet once daily for 1 week, then stop. 14 tablet 0   No facility-administered medications prior to visit.     Per HPI unless specifically indicated in ROS section below Review of Systems  Respiratory:  Negative for shortness of breath.   Cardiovascular:  Negative for chest pain and palpitations.  Psychiatric/Behavioral:  The patient is nervous/anxious.        See HPI  Objective:  Ht 5\' 10"  (1.778 m)   Wt 187 lb (84.8 kg)   BMI 26.83 kg/m   Wt Readings from Last 3 Encounters:  09/04/21 187 lb (84.8 kg)  07/24/21 187 lb (84.8 kg)  02/18/21 180 lb (81.6 kg)       Physical exam: Gen: alert, NAD, not ill appearing Pulm: speaks in complete sentences without increased work of breathing Psych: normal mood, normal thought content      Results for orders placed or performed in visit on 11/28/20  HM MAMMOGRAPHY  Result Value Ref Range  HM Mammogram 0-4 Bi-Rad 0-4 Bi-Rad, Self Reported Normal  HM PAP SMEAR  Result Value Ref Range   HM Pap smear ASCUS    Assessment & Plan:   Problem List Items Addressed This Visit       Other   Generalized anxiety disorder    Improved on venlafaxine ER 37.5 mg, continue same. Continue PRN hydroxyzine 10 mg.   She will update if anything changes.         No orders of the defined types were placed in this encounter.  No orders of the defined types were placed in this  encounter.   I discussed the assessment and treatment plan with the patient. The patient was provided an opportunity to ask questions and all were answered. The patient agreed with the plan and demonstrated an understanding of the instructions. The patient was advised to call back or seek an in-person evaluation if the symptoms worsen or if the condition fails to improve as anticipated.  Follow up plan:  Continue venlafaxine ER 37.5 mg daily.   It was a pleasure to see you today!   Pleas Koch, NP

## 2021-09-22 ENCOUNTER — Telehealth: Payer: BC Managed Care – PPO | Admitting: Physician Assistant

## 2021-09-22 DIAGNOSIS — J069 Acute upper respiratory infection, unspecified: Secondary | ICD-10-CM | POA: Diagnosis not present

## 2021-09-22 DIAGNOSIS — B9689 Other specified bacterial agents as the cause of diseases classified elsewhere: Secondary | ICD-10-CM

## 2021-09-22 MED ORDER — AZITHROMYCIN 250 MG PO TABS: ORAL_TABLET | ORAL | 0 refills | Status: AC

## 2021-09-22 MED ORDER — BENZONATATE 100 MG PO CAPS: 100.0000 mg | ORAL_CAPSULE | Freq: Three times a day (TID) | ORAL | 0 refills | Status: DC | PRN

## 2021-09-22 NOTE — Progress Notes (Signed)

## 2021-09-22 NOTE — Progress Notes (Signed)
I have spent 5 minutes in review of e-visit questionnaire, review and updating patient chart, medical decision making and response to patient.   Arsal Tappan Cody Dadrian Ballantine, PA-C    

## 2021-10-15 ENCOUNTER — Other Ambulatory Visit: Payer: Self-pay | Admitting: Primary Care

## 2021-10-15 DIAGNOSIS — F411 Generalized anxiety disorder: Secondary | ICD-10-CM

## 2022-02-27 IMAGING — DX DG ANKLE COMPLETE 3+V*R*
3 series · 3 of 3 positions shown · non-contrast
Comparison: None.

CLINICAL DATA: Lateral and posterior ankle pain, swelling, injury 2
months ago

EXAM:
RIGHT ANKLE - COMPLETE 3+ VIEW

[ankle ap]
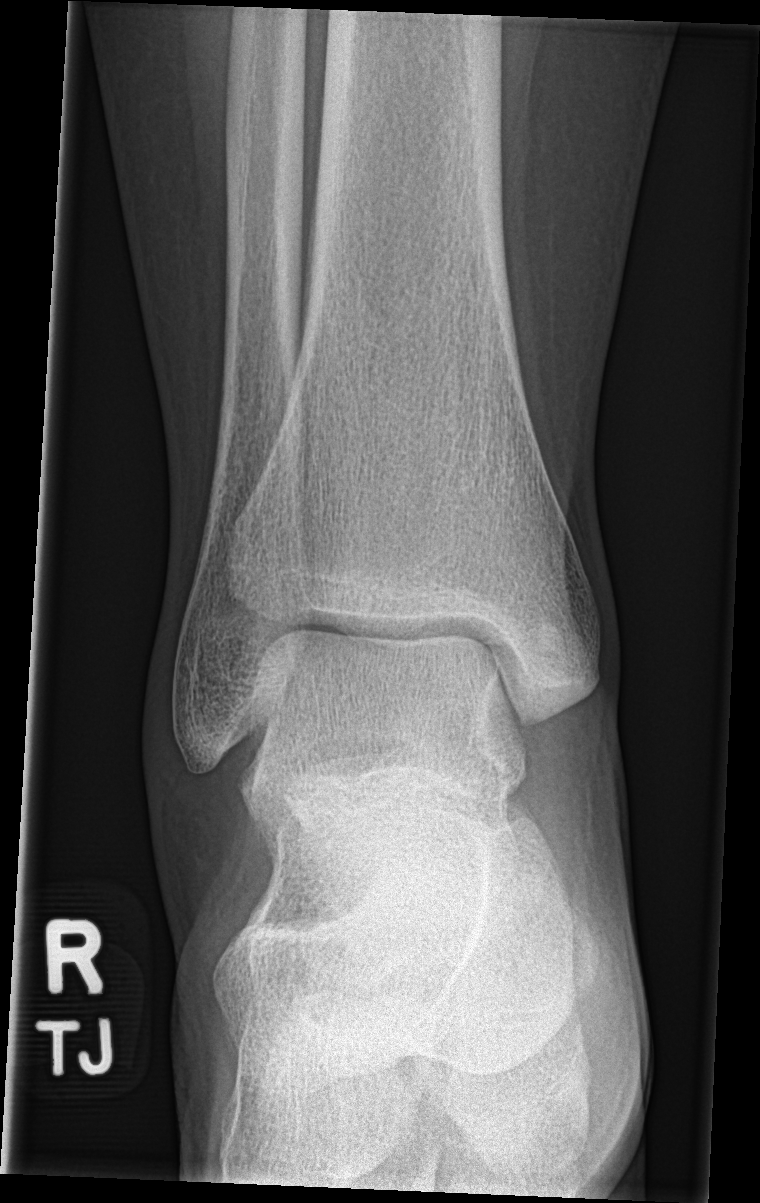

[ankle obl]
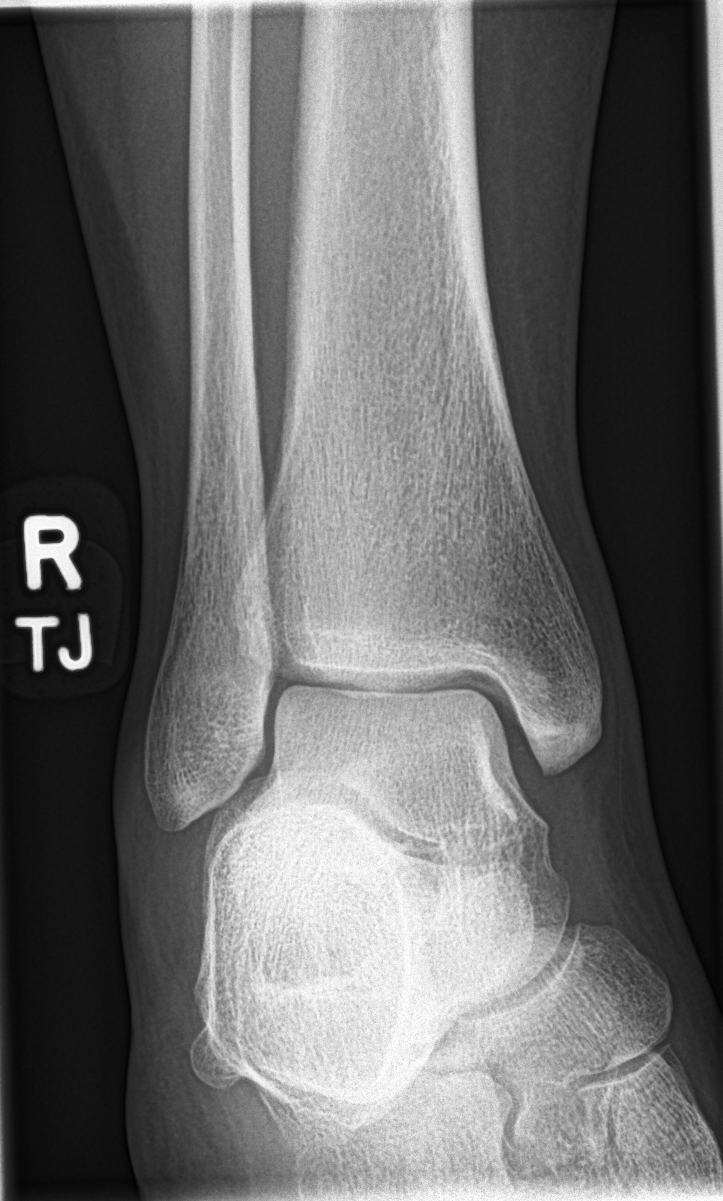

[ankle lat]
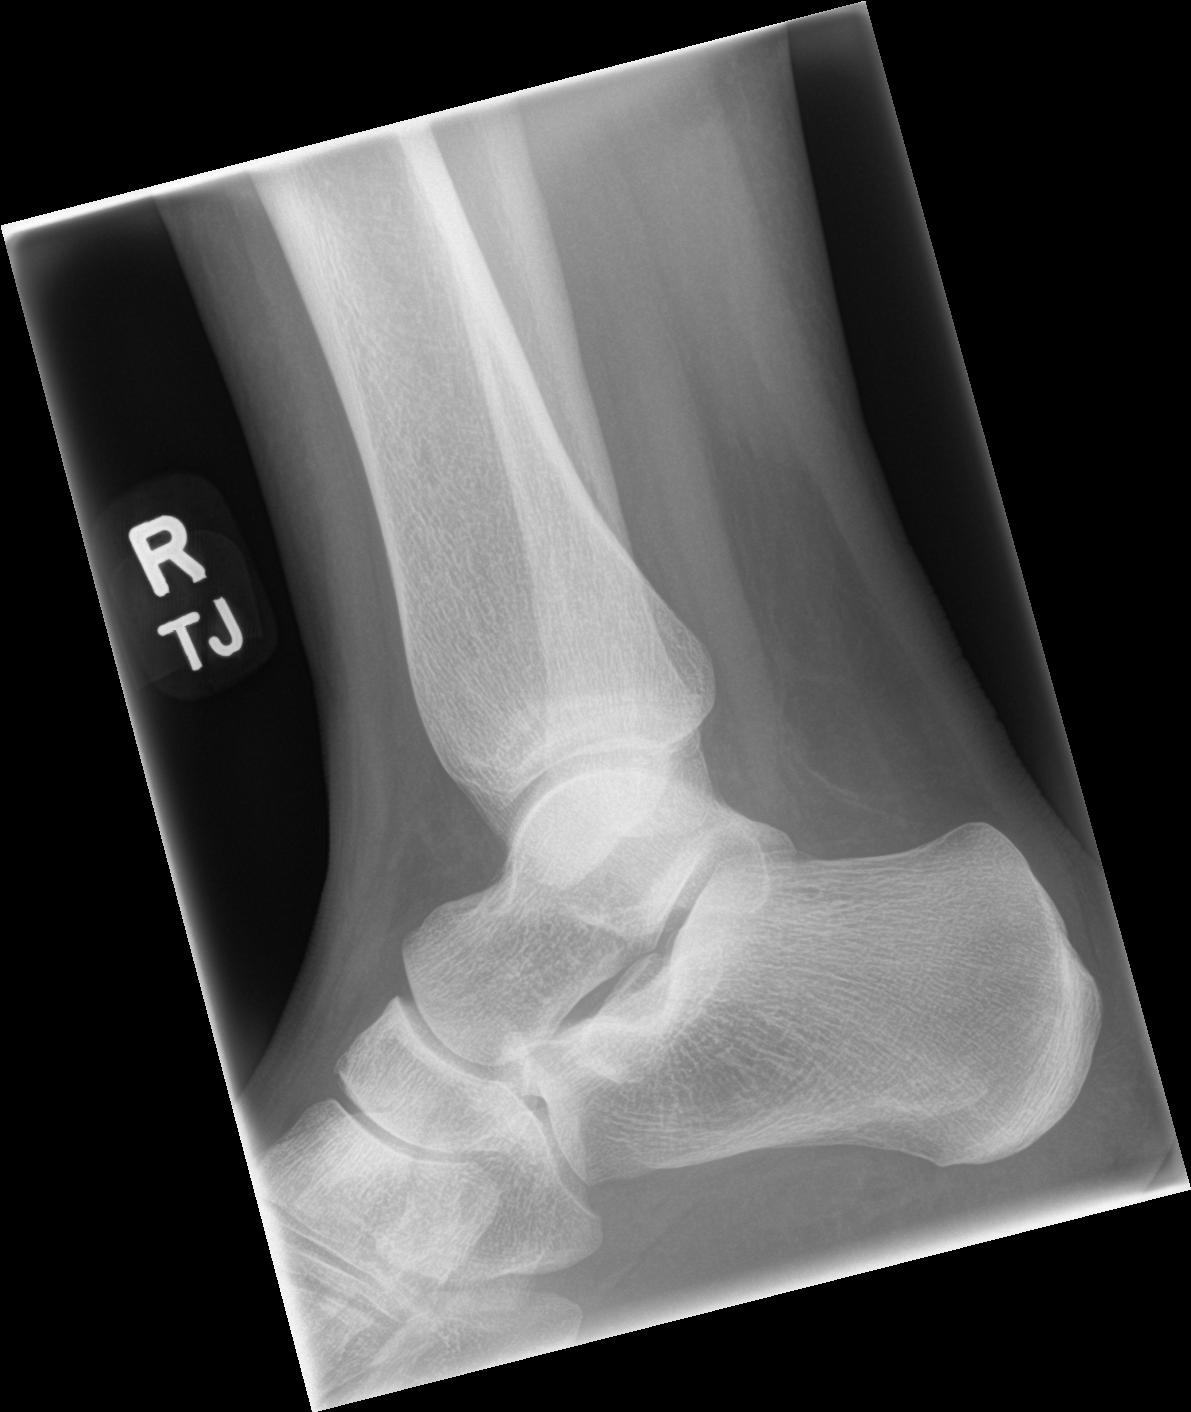

[3 of 3 positions shown; findings below may reference images not displayed]

FINDINGS: Frontal, oblique, lateral views of the right ankle are obtained. No
fracture, subluxation, or dislocation. Joint spaces are well
preserved. Soft tissues are normal.
IMPRESSION: 1. Unremarkable right ankle.

## 2022-03-04 ENCOUNTER — Ambulatory Visit: Payer: BC Managed Care – PPO | Admitting: Primary Care

## 2022-05-05 LAB — HM MAMMOGRAPHY

## 2022-05-28 ENCOUNTER — Encounter: Payer: Self-pay | Admitting: Primary Care

## 2022-05-28 ENCOUNTER — Ambulatory Visit (INDEPENDENT_AMBULATORY_CARE_PROVIDER_SITE_OTHER): Payer: BC Managed Care – PPO | Admitting: Primary Care

## 2022-05-28 DIAGNOSIS — E663 Overweight: Secondary | ICD-10-CM

## 2022-05-28 DIAGNOSIS — F411 Generalized anxiety disorder: Secondary | ICD-10-CM

## 2022-05-28 DIAGNOSIS — H9193 Unspecified hearing loss, bilateral: Secondary | ICD-10-CM | POA: Diagnosis not present

## 2022-05-28 MED ORDER — BUSPIRONE HCL 5 MG PO TABS: 5.0000 mg | ORAL_TABLET | Freq: Two times a day (BID) | ORAL | 0 refills | Status: DC

## 2022-05-28 NOTE — Patient Instructions (Signed)
You may take buspirone 5 mg twice daily if needed for acute anxiety.  Consider an evaluation with ENT for your hearing problem.  Keep up the great work with your weight loss!  It was a pleasure to see you today!

## 2022-05-28 NOTE — Progress Notes (Signed)
Subjective:    Patient ID: Kathy Sanders, female    DOB: 19-Dec-1980, 41 y.o.   MRN: [id number]  HPI  Kathy Sanders is a very pleasant 41 y.o. female with a history of overweight who presents today to discuss weight gain, changes in hearing, and anxiety.   1) Overweight: Weight gain over the last several years which occurred with a job change. She is less active overall, admits to eating more out of boredom.   She has been working to lose weight since May 2023. She is participating in "Diet-to-Go" with pre packed meal plans for breakfast and lunch. She is counting calories trying to stay under 1600 daily. She is tracking her carbs/protein/fats on an APP. She plans to start protein shakes soon.   2) GAD: Currently managed on venlafaxine ER 37.5 mg daily, and overall she feels well managed. On occasion she develops irritability and loses her patience, especially with her 39 year old son. She does continues to have mind racing thoughts at night with worrying. This is more frequent than usual now as she is out of school for the summer. The hydroxyzine 10 mg has helped but causes drowsiness. She is asking for an alternative.   3) Hearing Changes: Chronic for years. She has noticed difficulty deciphering words being spoken during a conversation if there is background noise such as others talking, music, TV, fans, etc. She's noticed this problem at home and at school. Her husband has noticed she cannot hear him if the TV is on. Her friends have pointed out the same.   She denies sinus congestion, head congestion, post nasal drip. When in a quite room, she has no problem hearing   BP Readings from Last 3 Encounters:  05/28/22 108/64  07/24/21 100/60  02/18/21 (!) 101/57   Wt Readings from Last 3 Encounters:  05/28/22 182 lb (82.6 kg)  09/04/21 187 lb (84.8 kg)  07/24/21 187 lb (84.8 kg)      Review of Systems  HENT:  Positive for hearing loss. Negative for congestion, ear pain,  postnasal drip, sinus pressure and tinnitus.   Respiratory:  Negative for shortness of breath.   Cardiovascular:  Negative for chest pain.  Psychiatric/Behavioral:  The patient is nervous/anxious.          Past Medical History:  Diagnosis Date   Anxiety    Chicken pox    COVID-19 08/2020    Social History   Socioeconomic History   Marital status: Married    Spouse name: Not on file   Number of children: 1   Years of education: Not on file   Highest education level: Not on file  Occupational History   Occupation: Pharmacist, hospital (Diplomatic Services operational officer)  Tobacco Use   Smoking status: Never   Smokeless tobacco: Never  Vaping Use   Vaping Use: Never used  Substance and Sexual Activity   Alcohol use: Yes    Alcohol/week: 2.0 standard drinks of alcohol    Types: 2 Glasses of wine per week   Drug use: Never   Sexual activity: Not on file  Other Topics Concern   Not on file  Social History Narrative   Not on file   Social Determinants of Health   Financial Resource Strain: Not on file  Food Insecurity: Not on file  Transportation Needs: Not on file  Physical Activity: Not on file  Stress: Not on file  Social Connections: Not on file  Intimate Partner Violence: Not on file    Past  Surgical History:  Procedure Laterality Date   CESAREAN SECTION N/A 07/01/2017   Procedure: CESAREAN SECTION;  Surgeon: Tyson Dense, MD;  Location: [facility];  Service: Obstetrics;  Laterality: N/A;   COLONOSCOPY  02/18/2021   WISDOM TOOTH EXTRACTION      Family History  Problem Relation Age of Onset   Hyperlipidemia Father    Alcohol abuse Maternal Uncle    Prostate cancer Maternal Uncle    Cancer Maternal Uncle        head and neck   Breast cancer Maternal Grandmother    Asthma Paternal Grandmother    Depression Mother    Celiac disease Sister    Colon cancer Neg Hx    Colon polyps Neg Hx    Esophageal cancer Neg Hx    Rectal cancer Neg Hx    Stomach cancer Neg Hx      Allergies  Allergen Reactions   Amoxicillin Nausea Only and Nausea And Vomiting    Has patient had a PCN reaction causing immediate rash, facial/tongue/throat swelling, SOB or lightheadedness with hypotension: No Has patient had a PCN reaction causing severe rash involving mucus membranes or skin necrosis: No Has patient had a PCN reaction that required hospitalization: No Has patient had a PCN reaction occurring within the last 10 years: No If all of the above answers are "NO", then may proceed with Cephalosporin use.     Current Outpatient Medications on File Prior to Visit  Medication Sig Dispense Refill   hydrOXYzine (ATARAX/VISTARIL) 10 MG tablet Take 1 tablet (10 mg total) by mouth 2 (two) times daily as needed for anxiety. 30 tablet 0   Multiple Vitamins-Minerals (ALIVE WOMENS GUMMY) CHEW      venlafaxine XR (EFFEXOR-XR) 37.5 MG 24 hr capsule TAKE 1 CAPSULE (37.5 MG TOTAL) BY MOUTH DAILY WITH BREAKFAST. FOR ANXIETY. 90 capsule 2   JUNEL FE 1/20 1-20 MG-MCG tablet Take 1 tablet by mouth daily.     No current facility-administered medications on file prior to visit.    BP 108/64   Pulse 82   Temp 98.7 F (37.1 C) (Oral)   Ht '5\' 10"'$  (1.778 m)   Wt 182 lb (82.6 kg)   SpO2 99%   BMI 26.11 kg/m  Objective:   Physical Exam HENT:     Right Ear: Tympanic membrane and ear canal normal. There is no impacted cerumen.     Left Ear: Tympanic membrane and ear canal normal. There is no impacted cerumen.  Cardiovascular:     Rate and Rhythm: Normal rate and regular rhythm.  Pulmonary:     Effort: Pulmonary effort is normal.     Breath sounds: Normal breath sounds.  Neurological:     Mental Status: She is alert.  Psychiatric:        Mood and Affect: Mood normal.           Assessment & Plan:   Problem List Items Addressed This Visit       Nervous and Auditory   Hearing problem of both ears    It is quite possible that she has some sort of hearing disorder. This  does not seem to be allergy related.  Offered ENT evaluation for which she currently declines. She will update if she changes her mind.        Other   Generalized anxiety disorder    Hydroxyzine causing drowsiness.  Continue venlafaxine ER 37.5 mg daily, consider dose increase to 75 mg if needed. Trial  of buspirone 5 mg twice daily as needed sent to pharmacy.  She will update if no improvement.      Relevant Medications   busPIRone (BUSPAR) 5 MG tablet   Overweight (BMI 25.0-29.9)    Commended her on dietary changes!! Encouraged to continue.          Pleas Koch, NP

## 2022-05-28 NOTE — Assessment & Plan Note (Signed)
Commended her on dietary changes!! Encouraged to continue.

## 2022-05-28 NOTE — Assessment & Plan Note (Signed)
Hydroxyzine causing drowsiness.  Continue venlafaxine ER 37.5 mg daily, consider dose increase to 75 mg if needed. Trial of buspirone 5 mg twice daily as needed sent to pharmacy.  She will update if no improvement.

## 2022-05-28 NOTE — Assessment & Plan Note (Signed)
It is quite possible that she has some sort of hearing disorder. This does not seem to be allergy related.  Offered ENT evaluation for which she currently declines. She will update if she changes her mind.

## 2022-06-30 ENCOUNTER — Other Ambulatory Visit: Payer: Self-pay | Admitting: Primary Care

## 2022-06-30 DIAGNOSIS — F411 Generalized anxiety disorder: Secondary | ICD-10-CM

## 2022-07-18 ENCOUNTER — Other Ambulatory Visit: Payer: Self-pay | Admitting: Primary Care

## 2022-07-18 DIAGNOSIS — F411 Generalized anxiety disorder: Secondary | ICD-10-CM

## 2022-08-12 ENCOUNTER — Ambulatory Visit: Payer: BC Managed Care – PPO | Admitting: Primary Care

## 2022-10-15 ENCOUNTER — Encounter: Payer: Self-pay | Admitting: Primary Care

## 2022-10-15 ENCOUNTER — Ambulatory Visit (INDEPENDENT_AMBULATORY_CARE_PROVIDER_SITE_OTHER): Payer: BC Managed Care – PPO | Admitting: Primary Care

## 2022-10-15 VITALS — BP 112/60 | HR 90 | Temp 97.7°F | Ht 70.0 in | Wt 174.5 lb

## 2022-10-15 DIAGNOSIS — F419 Anxiety disorder, unspecified: Secondary | ICD-10-CM | POA: Diagnosis not present

## 2022-10-15 DIAGNOSIS — L219 Seborrheic dermatitis, unspecified: Secondary | ICD-10-CM | POA: Diagnosis not present

## 2022-10-15 DIAGNOSIS — F32A Depression, unspecified: Secondary | ICD-10-CM | POA: Diagnosis not present

## 2022-10-15 DIAGNOSIS — R21 Rash and other nonspecific skin eruption: Secondary | ICD-10-CM

## 2022-10-15 MED ORDER — MOMETASONE FUROATE 0.1 % EX CREA: TOPICAL_CREAM | Freq: Two times a day (BID) | CUTANEOUS | 0 refills | Status: AC | PRN

## 2022-10-15 MED ORDER — KETOCONAZOLE 2 % EX CREA: TOPICAL_CREAM | CUTANEOUS | 0 refills | Status: DC

## 2022-10-15 MED ORDER — VENLAFAXINE HCL ER 75 MG PO CP24: 75.0000 mg | ORAL_CAPSULE | Freq: Every day | ORAL | 0 refills | Status: DC

## 2022-10-15 NOTE — Progress Notes (Signed)
Subjective:    Patient ID: Kathy Sanders, female    DOB: 1981/02/23, 41 y.o.   MRN: [id number]  Depression        Associated symptoms include fatigue.   Kathy Sanders is a very pleasant 41 y.o. female with a history of GAD who presents today to discuss depression/anxiety and dry skin.  1) Anxiety and Depression: Currently managed on venlafaxine ER 37.5 mg daily and buspirone 5 mg BID PRN for anxiety.  Over time she's noticed symptoms of depression.   Symptoms include "not feeling happy", little motivation to do things, occasional tearfulness, irritability, over thinking things, worrying, difficult sleeping with mind racing thoughts. Her son has begun to notice. Overall she does feel better in terms of her daily anxiety since management on venlafaxine ER 37.5 mg. The buspirone 5 mg does help with anxiety.   She was previously managed on Wellbutrin XL years ago, doesn't recall while she stopped taking. She was also managed on Zoloft last year which caused heart racing sensations. Her sister is managed on Lexapro.  2) Dry Skin Patches/Scalp Rash: Chronic and intermittent patches that occur to the scalp and lower extremities. The patches are itchy. History of dyshidrotic eczema, has been applying triamcinolone 0.1% cream to her patches with some improvement.   She's also noticed a try patch to the left frontal scalp. She often sees skin flakes, has tried several anti dandruff shampoos OTC with some improvement, but not much. Her scalp is itchy at times.    Review of Systems  Constitutional:  Positive for fatigue.  Psychiatric/Behavioral:  Positive for depression and sleep disturbance. The patient is nervous/anxious.        See HPI         Past Medical History:  Diagnosis Date   Anxiety    Chicken pox    COVID-19 08/2020    Social History   Socioeconomic History   Marital status: Married    Spouse name: Not on file   Number of children: 1   Years of education: Not on  file   Highest education level: Not on file  Occupational History   Occupation: Pharmacist, hospital (Diplomatic Services operational officer)  Tobacco Use   Smoking status: Never   Smokeless tobacco: Never  Vaping Use   Vaping Use: Never used  Substance and Sexual Activity   Alcohol use: Yes    Alcohol/week: 2.0 standard drinks of alcohol    Types: 2 Glasses of wine per week   Drug use: Never   Sexual activity: Not on file  Other Topics Concern   Not on file  Social History Narrative   Not on file   Social Determinants of Health   Financial Resource Strain: Not on file  Food Insecurity: Not on file  Transportation Needs: Not on file  Physical Activity: Not on file  Stress: Not on file  Social Connections: Not on file  Intimate Partner Violence: Not on file    Past Surgical History:  Procedure Laterality Date   CESAREAN SECTION N/A 07/01/2017   Procedure: CESAREAN SECTION;  Surgeon: Tyson Dense, MD;  Location: [facility];  Service: Obstetrics;  Laterality: N/A;   COLONOSCOPY  02/18/2021   WISDOM TOOTH EXTRACTION      Family History  Problem Relation Age of Onset   Hyperlipidemia Father    Alcohol abuse Maternal Uncle    Prostate cancer Maternal Uncle    Cancer Maternal Uncle        head and neck   Breast  cancer Maternal Grandmother    Asthma Paternal Grandmother    Depression Mother    Celiac disease Sister    Colon cancer Neg Hx    Colon polyps Neg Hx    Esophageal cancer Neg Hx    Rectal cancer Neg Hx    Stomach cancer Neg Hx     Allergies  Allergen Reactions   Amoxicillin Nausea Only and Nausea And Vomiting    Has patient had a PCN reaction causing immediate rash, facial/tongue/throat swelling, SOB or lightheadedness with hypotension: No Has patient had a PCN reaction causing severe rash involving mucus membranes or skin necrosis: No Has patient had a PCN reaction that required hospitalization: No Has patient had a PCN reaction occurring within the last 10 years: No If all  of the above answers are "NO", then may proceed with Cephalosporin use.     Current Outpatient Medications on File Prior to Visit  Medication Sig Dispense Refill   busPIRone (BUSPAR) 5 MG tablet TAKE 1 TABLET (5 MG TOTAL) BY MOUTH 2 (TWO) TIMES DAILY. FOR ANXIETY 180 tablet 0   Multiple Vitamins-Minerals (ALIVE WOMENS GUMMY) CHEW      No current facility-administered medications on file prior to visit.    BP 112/60 (BP Location: Left Arm, Patient Position: Sitting, Cuff Size: Normal)   Pulse 90   Temp 97.7 F (36.5 C) (Temporal)   Ht '5\' 10"'$  (1.778 m)   Wt 174 lb 8 oz (79.2 kg)   LMP 09/18/2022   SpO2 99%   BMI 25.04 kg/m  Objective:   Physical Exam Cardiovascular:     Rate and Rhythm: Normal rate and regular rhythm.  Pulmonary:     Effort: Pulmonary effort is normal.  Musculoskeletal:     Cervical back: Neck supple.  Skin:    General: Skin is warm and dry.     Comments: Faint, scaly, circular patch to right medial shin.  Flaking and scaly skin noticed to left frontal scalp           Assessment & Plan:   Problem List Items Addressed This Visit       Musculoskeletal and Integument   Rash and nonspecific skin eruption    Likely eczema.   Start mometasone 0.1% cream BID PRN.      Relevant Medications   mometasone (ELOCON) 0.1 % cream   Seborrheic dermatitis of scalp    Noted on exam today.  Start with ketoconazole 2% topical shampoo, twice weekly x 2-4 weeks. Then PRN.      Relevant Medications   ketoconazole (NIZORAL) 2 % cream     Other   Anxiety and depression - Primary    Anxiety overall improved, but there is room for further improvement. Now with depression symptoms.  Discussed options for treatment, we both agreed to start by increasing Venlafaxine ER to 75 mg daily. Continue Buspirone 5 mg 1-2 times daily PRN.  Follow up in 1 month.      Relevant Medications   venlafaxine XR (EFFEXOR XR) 75 MG 24 hr capsule       Pleas Koch, NP

## 2022-10-15 NOTE — Assessment & Plan Note (Signed)
Noted on exam today.  Start with ketoconazole 2% topical shampoo, twice weekly x 2-4 weeks. Then PRN.

## 2022-10-15 NOTE — Assessment & Plan Note (Signed)
Anxiety overall improved, but there is room for further improvement. Now with depression symptoms.  Discussed options for treatment, we both agreed to start by increasing Venlafaxine ER to 75 mg daily. Continue Buspirone 5 mg 1-2 times daily PRN.  Follow up in 1 month.

## 2022-10-15 NOTE — Assessment & Plan Note (Signed)
Likely eczema.   Start mometasone 0.1% cream BID PRN.

## 2022-10-15 NOTE — Patient Instructions (Signed)
We increased the dose of your venlafaxine to 75 mg daily for anxiety/depression.  Apply the mometasone cream to your legs as needed for the rash.  Apply the ketoconazole shampoo to the scalp twice weekly x 2-4 weeks, then as needed. Lather very well, leave in for 5 minutes, the rinse.   Schedule a follow up visit for 1 month.  It was a pleasure to see you today!

## 2022-12-21 DIAGNOSIS — F411 Generalized anxiety disorder: Secondary | ICD-10-CM

## 2022-12-22 MED ORDER — SERTRALINE HCL 50 MG PO TABS: 50.0000 mg | ORAL_TABLET | Freq: Every day | ORAL | 0 refills | Status: DC

## 2022-12-22 MED ORDER — VENLAFAXINE HCL ER 37.5 MG PO CP24: 37.5000 mg | ORAL_CAPSULE | Freq: Every day | ORAL | 0 refills | Status: DC

## 2023-01-08 DIAGNOSIS — F419 Anxiety disorder, unspecified: Secondary | ICD-10-CM

## 2023-01-11 ENCOUNTER — Other Ambulatory Visit: Payer: Self-pay | Admitting: Primary Care

## 2023-01-11 DIAGNOSIS — F419 Anxiety disorder, unspecified: Secondary | ICD-10-CM

## 2023-01-29 MED ORDER — VENLAFAXINE HCL ER 37.5 MG PO CP24: 37.5000 mg | ORAL_CAPSULE | Freq: Every day | ORAL | 0 refills | Status: DC

## 2023-02-18 ENCOUNTER — Encounter (HOSPITAL_COMMUNITY): Payer: Self-pay | Admitting: Obstetrics and Gynecology

## 2023-02-18 ENCOUNTER — Other Ambulatory Visit (HOSPITAL_COMMUNITY): Payer: Self-pay | Admitting: Obstetrics and Gynecology

## 2023-02-18 ENCOUNTER — Other Ambulatory Visit: Payer: Self-pay

## 2023-02-18 DIAGNOSIS — O034 Incomplete spontaneous abortion without complication: Secondary | ICD-10-CM

## 2023-02-18 NOTE — Progress Notes (Signed)
Spoke with pt for pre-op call. Pt denies cardiac history, HTN or Diabetes.   Shower instructions given to pt. 

## 2023-02-21 NOTE — Anesthesia Preprocedure Evaluation (Signed)
Anesthesia Evaluation  Patient identified by MRN, date of birth, ID band Patient awake    Reviewed: Allergy & Precautions, H&P , NPO status , Patient's Chart, lab work & pertinent test results  Airway Mallampati: II  TM Distance: >3 FB Neck ROM: Full    Dental no notable dental hx. (+) Dental Advisory Given, Teeth Intact   Pulmonary pneumonia, resolved, neg recent URI, Patient abstained from smoking.   Pulmonary exam normal breath sounds clear to auscultation       Cardiovascular negative cardio ROS Normal cardiovascular exam Rhythm:Regular Rate:Normal     Neuro/Psych  Headaches PSYCHIATRIC DISORDERS Anxiety Depression       GI/Hepatic negative GI ROS, Neg liver ROS,GERD  Controlled,,  Endo/Other  negative endocrine ROS  Obesity  Renal/GU negative Renal ROS     Musculoskeletal   Abdominal   Peds  Hematology negative hematology ROS (+) Blood dyscrasia, anemia   Anesthesia Other Findings   Reproductive/Obstetrics (+) Pregnancy                             Anesthesia Physical Anesthesia Plan  ASA: 2  Anesthesia Plan: General   Post-op Pain Management: Tylenol PO (pre-op)* and Gabapentin PO (pre-op)*   Induction: Intravenous  PONV Risk Score and Plan: 4 or greater and Scopolamine patch - Pre-op, Treatment may vary due to age or medical condition, Midazolam, Ondansetron and Dexamethasone  Airway Management Planned: LMA  Additional Equipment:   Intra-op Plan:   Post-operative Plan: Extubation in OR  Informed Consent: I have reviewed the patients History and Physical, chart, labs and discussed the procedure including the risks, benefits and alternatives for the proposed anesthesia with the patient or authorized representative who has indicated his/her understanding and acceptance.     Dental advisory given  Plan Discussed with: CRNA  Anesthesia Plan Comments:          Anesthesia Quick Evaluation

## 2023-02-21 NOTE — H&P (Signed)
Kathy Sanders is an 42 y.o. female with missed Ab presents for surgical management.  She has used miso x 2 and continues to show rPOC on TVUS.  S/p rhogam    No LMP recorded.    Past Medical History:  Diagnosis Date   ADHD (attention deficit hyperactivity disorder)    ADD   Anxiety    Chicken pox    COVID    x 2 -   COVID-19 08/2020   Pneumonia    walking pneumonia    Past Surgical History:  Procedure Laterality Date   CESAREAN SECTION N/A 07/01/2017   Procedure: CESAREAN SECTION;  Surgeon: Ranae Pila, MD;  Location: [facility] BIRTHING SUITES;  Service: Obstetrics;  Laterality: N/A;   COLONOSCOPY  02/18/2021   WISDOM TOOTH EXTRACTION      Family History  Problem Relation Age of Onset   Hyperlipidemia Father    Alcohol abuse Maternal Uncle    Prostate cancer Maternal Uncle    Cancer Maternal Uncle        head and neck   Breast cancer Maternal Grandmother    Asthma Paternal Grandmother    Depression Mother    Celiac disease Sister    Colon cancer Neg Hx    Colon polyps Neg Hx    Esophageal cancer Neg Hx    Rectal cancer Neg Hx    Stomach cancer Neg Hx     Social History:  reports that she has never smoked. She has never been exposed to tobacco smoke. She has never used smokeless tobacco. She reports current alcohol use of about 2.0 standard drinks of alcohol per week. She reports that she does not use drugs.  Allergies:  Allergies  Allergen Reactions   Amoxicillin Nausea Only    Has patient had a PCN reaction causing immediate rash, facial/tongue/throat swelling, SOB or lightheadedness with hypotension: No Has patient had a PCN reaction causing severe rash involving mucus membranes or skin necrosis: No Has patient had a PCN reaction that required hospitalization: No Has patient had a PCN reaction occurring within the last 10 years: No If all of the above answers are "NO", then may proceed with Cephalosporin use.     Meds:  PNV  AF, VSS Physical  Exam Gen - NAD CV - RRR Lungs - clear B Abd - soft, NT/ND PV - deferred  PVUS:  collapsed GS, no FHT   Assessment/Plan:  Incompete/Missed Ab S/p miso and rhogam Plan for suction D&E with US guidance R/b/a discussed and informed consent obtained   Kathy Sanders 02/21/2023, 8:26 PM

## 2023-02-22 ENCOUNTER — Encounter (HOSPITAL_COMMUNITY): Payer: Self-pay | Admitting: Obstetrics and Gynecology

## 2023-02-22 ENCOUNTER — Ambulatory Visit (HOSPITAL_COMMUNITY)
Admission: RE | Admit: 2023-02-22 | Discharge: 2023-02-22 | Disposition: A | Discharge status: Home / Self Care | Payer: BC Managed Care – PPO | Source: Ambulatory Visit

## 2023-02-22 ENCOUNTER — Ambulatory Visit (HOSPITAL_COMMUNITY): Payer: BC Managed Care – PPO | Admitting: Anesthesiology

## 2023-02-22 ENCOUNTER — Other Ambulatory Visit: Payer: Self-pay

## 2023-02-22 ENCOUNTER — Encounter (HOSPITAL_COMMUNITY)
Admission: RE | Disposition: A | Discharge status: Home / Self Care | Payer: Self-pay | Source: Home / Self Care | Attending: Obstetrics and Gynecology

## 2023-02-22 ENCOUNTER — Ambulatory Visit (HOSPITAL_COMMUNITY)
Admission: RE | Admit: 2023-02-22 | Discharge: 2023-02-22 | Disposition: A | Discharge status: Home / Self Care | Payer: BC Managed Care – PPO | Attending: Obstetrics and Gynecology | Admitting: Obstetrics and Gynecology

## 2023-02-22 DIAGNOSIS — K219 Gastro-esophageal reflux disease without esophagitis: Secondary | ICD-10-CM | POA: Insufficient documentation

## 2023-02-22 DIAGNOSIS — O034 Incomplete spontaneous abortion without complication: Secondary | ICD-10-CM | POA: Insufficient documentation

## 2023-02-22 DIAGNOSIS — O021 Missed abortion: Secondary | ICD-10-CM | POA: Diagnosis present

## 2023-02-22 HISTORY — DX: COVID-19: U07.1

## 2023-02-22 HISTORY — DX: Attention-deficit hyperactivity disorder, unspecified type: F90.9

## 2023-02-22 HISTORY — PX: DILATION AND EVACUATION: SHX1459

## 2023-02-22 HISTORY — DX: Pneumonia, unspecified organism: J18.9

## 2023-02-22 HISTORY — PX: OPERATIVE ULTRASOUND: SHX5996

## 2023-02-22 LAB — CBC
HCT: 39.7 % (ref 36.0–46.0)
Hemoglobin: 13.7 g/dL (ref 12.0–15.0)
MCH: 31.4 pg (ref 26.0–34.0)
MCHC: 34.5 g/dL (ref 30.0–36.0)
MCV: 91.1 fL (ref 80.0–100.0)
Platelets: 232 10*3/uL (ref 150–400)
RBC: 4.36 MIL/uL (ref 3.87–5.11)
RDW: 12.7 % (ref 11.5–15.5)
WBC: 5.5 10*3/uL (ref 4.0–10.5)
nRBC: 0 % (ref 0.0–0.2)

## 2023-02-22 LAB — TYPE AND SCREEN
ABO/RH(D): A NEG
Antibody Screen: POSITIVE

## 2023-02-22 LAB — HIV ANTIBODY (ROUTINE TESTING W REFLEX): HIV Screen 4th Generation wRfx: NONREACTIVE

## 2023-02-22 LAB — RPR: RPR Ser Ql: NONREACTIVE

## 2023-02-22 SURGERY — DILATION AND EVACUATION, UTERUS
Anesthesia: General | Site: Uterus

## 2023-02-22 MED ORDER — FENTANYL CITRATE (PF) 100 MCG/2ML IJ SOLN: 25.0000 ug | INTRAMUSCULAR | Status: DC | PRN

## 2023-02-22 MED ORDER — FENTANYL CITRATE (PF) 250 MCG/5ML IJ SOLN
INTRAMUSCULAR | Status: AC
  Filled 2023-02-22: qty 5

## 2023-02-22 MED ORDER — METHYLERGONOVINE MALEATE 0.2 MG/ML IJ SOLN
INTRAMUSCULAR | Status: DC | PRN
  Administered 2023-02-22: .2 mg via INTRAMUSCULAR

## 2023-02-22 MED ORDER — LIDOCAINE 2% (20 MG/ML) 5 ML SYRINGE
INTRAMUSCULAR | Status: DC | PRN
  Administered 2023-02-22: 80 mg via INTRAVENOUS

## 2023-02-22 MED ORDER — SUCCINYLCHOLINE CHLORIDE 200 MG/10ML IV SOSY
PREFILLED_SYRINGE | INTRAVENOUS | Status: DC | PRN
  Administered 2023-02-22: 80 mg via INTRAVENOUS

## 2023-02-22 MED ORDER — ORAL CARE MOUTH RINSE: 15.0000 mL | Freq: Once | OROMUCOSAL | Status: AC

## 2023-02-22 MED ORDER — OXYCODONE HCL 5 MG PO TABS: 5.0000 mg | ORAL_TABLET | Freq: Once | ORAL | Status: DC | PRN

## 2023-02-22 MED ORDER — ONDANSETRON HCL 4 MG/2ML IJ SOLN
INTRAMUSCULAR | Status: DC | PRN
  Administered 2023-02-22: 4 mg via INTRAVENOUS

## 2023-02-22 MED ORDER — IBUPROFEN 600 MG PO TABS: 600.0000 mg | ORAL_TABLET | Freq: Four times a day (QID) | ORAL | 1 refills | Status: DC | PRN

## 2023-02-22 MED ORDER — 0.9 % SODIUM CHLORIDE (POUR BTL) OPTIME
TOPICAL | Status: DC | PRN
  Administered 2023-02-22: 1000 mL

## 2023-02-22 MED ORDER — DOXYCYCLINE HYCLATE 100 MG PO TABS
200.0000 mg | ORAL_TABLET | Freq: Once | ORAL | Status: AC
  Administered 2023-02-22: 200 mg via ORAL
  Filled 2023-02-22: qty 2

## 2023-02-22 MED ORDER — METHYLERGONOVINE MALEATE 0.2 MG PO TABS: 0.2000 mg | ORAL_TABLET | Freq: Three times a day (TID) | ORAL | 0 refills | Status: DC

## 2023-02-22 MED ORDER — ACETAMINOPHEN 500 MG PO TABS
1000.0000 mg | ORAL_TABLET | Freq: Once | ORAL | Status: AC
  Administered 2023-02-22: 1000 mg via ORAL
  Filled 2023-02-22: qty 2

## 2023-02-22 MED ORDER — AMISULPRIDE (ANTIEMETIC) 5 MG/2ML IV SOLN: 10.0000 mg | Freq: Once | INTRAVENOUS | Status: DC | PRN

## 2023-02-22 MED ORDER — MIDAZOLAM HCL 2 MG/2ML IJ SOLN
INTRAMUSCULAR | Status: DC | PRN
  Administered 2023-02-22: 2 mg via INTRAVENOUS

## 2023-02-22 MED ORDER — PROMETHAZINE HCL 25 MG/ML IJ SOLN: 6.2500 mg | INTRAMUSCULAR | Status: DC | PRN

## 2023-02-22 MED ORDER — DEXAMETHASONE SODIUM PHOSPHATE 10 MG/ML IJ SOLN
INTRAMUSCULAR | Status: DC | PRN
  Administered 2023-02-22: 8 mg via INTRAVENOUS

## 2023-02-22 MED ORDER — FENTANYL CITRATE (PF) 250 MCG/5ML IJ SOLN
INTRAMUSCULAR | Status: DC | PRN
  Administered 2023-02-22: 100 ug via INTRAVENOUS

## 2023-02-22 MED ORDER — CHLOROPROCAINE HCL 1 % IJ SOLN
INTRAMUSCULAR | Status: DC | PRN
  Administered 2023-02-22: 7 mL

## 2023-02-22 MED ORDER — PROPOFOL 10 MG/ML IV BOLUS
INTRAVENOUS | Status: DC | PRN
  Administered 2023-02-22: 170 mg via INTRAVENOUS

## 2023-02-22 MED ORDER — PROPOFOL 10 MG/ML IV BOLUS
INTRAVENOUS | Status: AC
  Filled 2023-02-22: qty 20

## 2023-02-22 MED ORDER — PHENYLEPHRINE 80 MCG/ML (10ML) SYRINGE FOR IV PUSH (FOR BLOOD PRESSURE SUPPORT)
PREFILLED_SYRINGE | INTRAVENOUS | Status: DC | PRN
  Administered 2023-02-22: 80 ug via INTRAVENOUS
  Administered 2023-02-22 (×2): 160 ug via INTRAVENOUS

## 2023-02-22 MED ORDER — CHLOROPROCAINE HCL 1 % IJ SOLN
INTRAMUSCULAR | Status: AC
  Filled 2023-02-22: qty 30

## 2023-02-22 MED ORDER — MIDAZOLAM HCL 2 MG/2ML IJ SOLN
INTRAMUSCULAR | Status: AC
  Filled 2023-02-22: qty 2

## 2023-02-22 MED ORDER — METHYLERGONOVINE MALEATE 0.2 MG/ML IJ SOLN
INTRAMUSCULAR | Status: AC
  Filled 2023-02-22: qty 1

## 2023-02-22 MED ORDER — GABAPENTIN 300 MG PO CAPS
300.0000 mg | ORAL_CAPSULE | Freq: Once | ORAL | Status: AC
  Administered 2023-02-22: 300 mg via ORAL
  Filled 2023-02-22: qty 1

## 2023-02-22 MED ORDER — SCOPOLAMINE 1 MG/3DAYS TD PT72
1.0000 | MEDICATED_PATCH | TRANSDERMAL | Status: DC
  Administered 2023-02-22: 1.5 mg via TRANSDERMAL
  Filled 2023-02-22: qty 1

## 2023-02-22 MED ORDER — CHLORHEXIDINE GLUCONATE 0.12 % MT SOLN
15.0000 mL | Freq: Once | OROMUCOSAL | Status: AC
  Administered 2023-02-22: 15 mL via OROMUCOSAL

## 2023-02-22 MED ORDER — MEPERIDINE HCL 25 MG/ML IJ SOLN: 6.2500 mg | INTRAMUSCULAR | Status: DC | PRN

## 2023-02-22 MED ORDER — OXYCODONE HCL 5 MG/5ML PO SOLN: 5.0000 mg | Freq: Once | ORAL | Status: DC | PRN

## 2023-02-22 MED ORDER — POVIDONE-IODINE 10 % EX SWAB
2.0000 | Freq: Once | CUTANEOUS | Status: AC
  Administered 2023-02-22: 2 via TOPICAL

## 2023-02-22 MED ORDER — LACTATED RINGERS IV SOLN: INTRAVENOUS | Status: DC

## 2023-02-22 MED ORDER — CHLORHEXIDINE GLUCONATE 0.12 % MT SOLN
OROMUCOSAL | Status: AC
  Filled 2023-02-22: qty 15

## 2023-02-22 SURGICAL SUPPLY — 21 items
CATH ROBINSON RED A/P 16FR (CATHETERS) ×2 IMPLANT
FILTER UTR ASPR ASSEMBLY (MISCELLANEOUS) ×2 IMPLANT
GAUZE 4X4 16PLY ~~LOC~~+RFID DBL (SPONGE) IMPLANT
GLOVE BIO SURGEON STRL SZ 6.5 (GLOVE) ×2 IMPLANT
GLOVE BIOGEL PI IND STRL 7.0 (GLOVE) ×4 IMPLANT
GOWN STRL REUS W/ TWL LRG LVL3 (GOWN DISPOSABLE) ×4 IMPLANT
GOWN STRL REUS W/TWL LRG LVL3 (GOWN DISPOSABLE) ×4
HOSE CONNECTING 18IN BERKELEY (TUBING) ×2 IMPLANT
KIT BERKELEY 1ST TRI 3/8 NO TR (MISCELLANEOUS) ×2 IMPLANT
KIT BERKELEY 1ST TRIMESTER 3/8 (MISCELLANEOUS) ×2 IMPLANT
NS IRRIG 1000ML POUR BTL (IV SOLUTION) ×2 IMPLANT
PACK VAGINAL MINOR WOMEN LF (CUSTOM PROCEDURE TRAY) ×2 IMPLANT
PAD OB MATERNITY 4.3X12.25 (PERSONAL CARE ITEMS) ×2 IMPLANT
SET BERKELEY SUCTION TUBING (SUCTIONS) ×2 IMPLANT
SPIKE FLUID TRANSFER (MISCELLANEOUS) ×2 IMPLANT
TOWEL GREEN STERILE FF (TOWEL DISPOSABLE) ×4 IMPLANT
UNDERPAD 30X36 HEAVY ABSORB (UNDERPADS AND DIAPERS) ×2 IMPLANT
VACURETTE 10 RIGID CVD (CANNULA) IMPLANT
VACURETTE 7MM CVD STRL WRAP (CANNULA) IMPLANT
VACURETTE 8 RIGID CVD (CANNULA) IMPLANT
VACURETTE 9 RIGID CVD (CANNULA) IMPLANT

## 2023-02-22 NOTE — Progress Notes (Signed)
H&P reviewed and surgery discussed with pt No changes

## 2023-02-22 NOTE — Transfer of Care (Signed)
Immediate Anesthesia Transfer of Care Note  Patient: Kathy Sanders  Procedure(s) Performed: DILATATION AND EVACUATION (Uterus) OPERATIVE ULTRASOUND (Abdomen)  Patient Location: PACU  Anesthesia Type:General  Level of Consciousness: awake, alert , and oriented  Airway & Oxygen Therapy: Patient Spontanous Breathing and Patient connected to face mask oxygen  Post-op Assessment: Report given to RN and Post -op Vital signs reviewed and stable  Post vital signs: Reviewed and stable  Last Vitals:  Vitals Value Taken Time  BP 108/73 02/22/23 0820  Temp    Pulse 80 02/22/23 0823  Resp 12 02/22/23 0823  SpO2 98 % 02/22/23 0823  Vitals shown include unvalidated device data.  Last Pain:  Vitals:   02/22/23 0619  TempSrc:   PainSc: 2       Patients Stated Pain Goal: 0 (02/22/23 3546)  Complications: No notable events documented.

## 2023-02-22 NOTE — Anesthesia Procedure Notes (Addendum)
Procedure Name: Intubation Date/Time: 02/22/2023 7:37 AM  Performed by: Katina Degree, CRNAPre-anesthesia Checklist: Patient identified, Emergency Drugs available, Suction available and Patient being monitored Patient Re-evaluated:Patient Re-evaluated prior to induction Oxygen Delivery Method: Circle system utilized Preoxygenation: Pre-oxygenation with 100% oxygen Induction Type: IV induction, Cricoid Pressure applied and Rapid sequence Laryngoscope Size: Mac and 4 Grade View: Grade I Tube type: Oral Tube size: 7.0 mm Number of attempts: 1 Airway Equipment and Method: Stylet and Oral airway Placement Confirmation: ETT inserted through vocal cords under direct vision, positive ETCO2 and breath sounds checked- equal and bilateral Secured at: 21 cm Tube secured with: Tape Dental Injury: Teeth and Oropharynx as per pre-operative assessment

## 2023-02-22 NOTE — Discharge Instructions (Signed)
[facility] office 2-3 weeks for postop appointment.  Call the office [phone] for an appointment.  Personal Hygiene: Use pads not tampons x 1week You may shower, no tub baths or pools for 2-3 weeks Wipe from front to back when using restroom  Activity: Do not drive or operate any equipment for 24 hrs.   Do not rest in bed all day Walking is encouraged Walk up and down stairs slowly You may return to your normal activity in 1-2 days  Sexual Activity:  No intercourse for 2 weeks after the procedure.  Diet: Eat a light meal as desired this evening.  You may resume your usual diet tomorrow.  Return to work:  You may resume your work activities after 1-2 days  What to expect:  Expect to have vaginal bleeding/discharge for 2-3 days and light to moderate bleeding for 10-14 days.  It is not unusual to have soreness for 1-2 weeks.  You may have a slight burning sensation when you urinate for the first few days.  You may start your menses in 2-6 weeks.  Mild cramps may continue for a couple of days.    Call your doctor:   Excessive bleeding, saturating a pad every hour Inability to urinate 6 hours after discharge Pain not relieved with pain medications Fever of 100.4 or greater

## 2023-02-22 NOTE — Op Note (Signed)
R/B/A of procedure were d/w patient and informed consent obtained.  Pt was taken to the OR w/ IV running and placed in supine position.  MAC anesthesia was obtained and she was placed in dorsal lithotomy w/ allen stirrups.  Prepped and draped with sterile procedure and in and out catheter used to drain her bladder.  Bivalved speculum was placed in vagina and 1 cc of 1% lidocaine injected at 12 oclock on the cervix.  Single tooth tenaculum was attached to the anterior lip of the cervix and a cervical block was performed.  The cervix was serially dilated with pratt dilators.  8 french suction catheter was used and POC removed from uterus.  A sponge stick was also used to grasp and remove POC.  A gentle curetting was then performed until uterine cry was noted.  Suction catheter reinserted to remove clots and no more tissue noted.  Ultrasound was performed to confirm all tissue was removed from the uterus. Tenaculum removed and cervix was hemostatic.  Speculum removed.  All counts were correct.

## 2023-02-23 ENCOUNTER — Encounter (HOSPITAL_COMMUNITY): Payer: Self-pay | Admitting: Obstetrics and Gynecology

## 2023-02-23 LAB — SURGICAL PATHOLOGY

## 2023-02-23 NOTE — Anesthesia Postprocedure Evaluation (Signed)
Anesthesia Post Note  Patient: Kathy Sanders  Procedure(s) Performed: DILATATION AND EVACUATION (Uterus) OPERATIVE ULTRASOUND (Abdomen)     Patient location during evaluation: PACU Anesthesia Type: General Level of consciousness: awake and alert Pain management: pain level controlled Vital Signs Assessment: post-procedure vital signs reviewed and stable Respiratory status: spontaneous breathing Cardiovascular status: stable Anesthetic complications: no   No notable events documented.  Last Vitals:  Vitals:   02/22/23 0830 02/22/23 0845  BP: 111/76 105/74  Pulse: 84 73  Resp: 17 10  Temp:  36.6 C  SpO2: 100% 98%    Last Pain:  Vitals:   02/22/23 0845  TempSrc:   PainSc: 0-No pain                 Lewie Loron

## 2023-02-24 ENCOUNTER — Ambulatory Visit: Payer: BC Managed Care – PPO | Admitting: Primary Care

## 2023-02-24 ENCOUNTER — Encounter: Payer: Self-pay | Admitting: Primary Care

## 2023-02-24 VITALS — BP 124/86 | HR 73 | Temp 97.2°F | Ht 70.0 in | Wt 185.0 lb

## 2023-02-24 DIAGNOSIS — G479 Sleep disorder, unspecified: Secondary | ICD-10-CM | POA: Diagnosis not present

## 2023-02-24 NOTE — Progress Notes (Signed)
Subjective:    Patient ID: Kathy Sanders, female    DOB: 07-Feb-1981, 42 y.o.   MRN: [id number]  Insomnia Primary symptoms: sleep disturbance.      Kathy Sanders is a very pleasant 42 y.o. female with a history of anxiety and depression, frequent headaches, recent miscarriage who presents today to discuss difficulty sleeping.  Chronic since August 2023. Symptoms include difficulty falling asleep with a "jolting" like sensation that causes her to awaken. The jolting feeling will occur intermittently for hours and then she will eventually fall back asleep. Her symptoms do not occur nightly, mostly several times monthly for several days in a row.   She is managed on buspirone for anxiety for which she takes as needed. Also managed on Zyrtec for which she takes daily.   She does experience mind racing thoughts at night on occasion. Overall she feels that her anxiety is controlled during the day. She does notice intermittent lower back pain during the day, improved when laying down.   She denies numbness or weakness to lower extremities. She denies family history of neurological disorders. She's cut back on caffeine. She does not consume sugar before bedtime.   Review of Systems  Musculoskeletal:  Positive for back pain.  Neurological:  Negative for weakness and numbness.  Psychiatric/Behavioral:  Positive for sleep disturbance. The patient is nervous/anxious and has insomnia.          Past Medical History:  Diagnosis Date   ADHD (attention deficit hyperactivity disorder)    ADD   Anxiety    Chicken pox    COVID    x 2 -   COVID-19 08/2020   Pneumonia    walking pneumonia    Social History   Socioeconomic History   Marital status: Married    Spouse name: Not on file   Number of children: 1   Years of education: Not on file   Highest education level: Bachelor's degree (e.g., BA, AB, BS)  Occupational History   Occupation: Runner, broadcasting/film/video (Social research officer, government)  Tobacco Use    Smoking status: Never    Passive exposure: Never   Smokeless tobacco: Never  Vaping Use   Vaping Use: Never used  Substance and Sexual Activity   Alcohol use: Yes    Alcohol/week: 2.0 standard drinks of alcohol    Types: 2 Glasses of wine per week   Drug use: Never   Sexual activity: Not on file  Other Topics Concern   Not on file  Social History Narrative   Not on file   Social Determinants of Health   Financial Resource Strain: Low Risk  (02/20/2023)   Overall Financial Resource Strain (CARDIA)    Difficulty of Paying Living Expenses: Not very hard  Food Insecurity: No Food Insecurity (02/20/2023)   Hunger Vital Sign    Worried About Running Out of Food in the Last Year: Never true    Ran Out of Food in the Last Year: Never true  Transportation Needs: No Transportation Needs (02/20/2023)   PRAPARE - Administrator, Civil Service (Medical): No    Lack of Transportation (Non-Medical): No  Physical Activity: Unknown (02/20/2023)   Exercise Vital Sign    Days of Exercise per Week: 0 days    Minutes of Exercise per Session: Not on file  Stress: Stress Concern Present (02/20/2023)   [employer] of Occupational Health - Occupational Stress Questionnaire    Feeling of Stress : To some extent  Social Connections: Socially Isolated (02/20/2023)  Social Connection and Isolation Panel [NHANES]    Frequency of Communication with Friends and Family: Twice a week    Frequency of Social Gatherings with Friends and Family: Never    Attends Religious Services: Never    Database administrator or Organizations: No    Attends Engineer, structural: Not on file    Marital Status: Married  Catering manager Violence: Not on file    Past Surgical History:  Procedure Laterality Date   CESAREAN SECTION N/A 07/01/2017   Procedure: CESAREAN SECTION;  Surgeon: Ranae Pila, MD;  Location: [facility] BIRTHING SUITES;  Service: Obstetrics;  Laterality: N/A;   COLONOSCOPY   02/18/2021   DILATION AND EVACUATION N/A 02/22/2023   Procedure: DILATATION AND EVACUATION;  Surgeon: Zelphia Cairo, MD;  Location: [facility] OR;  Service: Gynecology;  Laterality: N/A;   OPERATIVE ULTRASOUND N/A 02/22/2023   Procedure: OPERATIVE ULTRASOUND;  Surgeon: Zelphia Cairo, MD;  Location: [facility] OR;  Service: Gynecology;  Laterality: N/A;   WISDOM TOOTH EXTRACTION      Family History  Problem Relation Age of Onset   Hyperlipidemia Father    Alcohol abuse Maternal Uncle    Prostate cancer Maternal Uncle    Cancer Maternal Uncle        head and neck   Breast cancer Maternal Grandmother    Asthma Paternal Grandmother    Depression Mother    Celiac disease Sister    Colon cancer Neg Hx    Colon polyps Neg Hx    Esophageal cancer Neg Hx    Rectal cancer Neg Hx    Stomach cancer Neg Hx     Allergies  Allergen Reactions   Amoxicillin Nausea Only    Has patient had a PCN reaction causing immediate rash, facial/tongue/throat swelling, SOB or lightheadedness with hypotension: No Has patient had a PCN reaction causing severe rash involving mucus membranes or skin necrosis: No Has patient had a PCN reaction that required hospitalization: No Has patient had a PCN reaction occurring within the last 10 years: No If all of the above answers are "NO", then may proceed with Cephalosporin use.     Current Outpatient Medications on File Prior to Visit  Medication Sig Dispense Refill   b complex vitamins capsule Take 1 capsule by mouth daily.     busPIRone (BUSPAR) 5 MG tablet TAKE 1 TABLET (5 MG TOTAL) BY MOUTH 2 (TWO) TIMES DAILY. FOR ANXIETY (Patient taking differently: Take 5 mg by mouth 2 (two) times daily as needed (anixety).) 180 tablet 0   cetirizine (ZYRTEC) 10 MG tablet Take 10 mg by mouth daily.     ibuprofen (ADVIL) 600 MG tablet Take 1 tablet (600 mg total) by mouth every 6 (six) hours as needed. 30 tablet 1   methylergonovine (METHERGINE) 0.2 MG tablet Take 1 tablet (0.2 mg total)  by mouth 3 (three) times daily. 9 tablet 0   mometasone (ELOCON) 0.1 % cream Apply topically 2 (two) times daily as needed. 45 g 0   Multiple Vitamins-Minerals (ALIVE WOMENS GUMMY) CHEW Chew 2 capsules by mouth daily.     ketoconazole (NIZORAL) 2 % cream Apply twice weekly to scalp as needed. (Patient not taking: Reported on 02/18/2023) 30 g 0   No current facility-administered medications on file prior to visit.    BP 124/86   Pulse 73   Temp (!) 97.2 F (36.2 C) (Temporal)   Ht 5\' 10"  (1.778 m)   Wt 185 lb (83.9 kg)  LMP 11/16/2022 (Exact Date)   SpO2 94%   BMI 26.54 kg/m  Objective:   Physical Exam Eyes:     Extraocular Movements: Extraocular movements intact.  Cardiovascular:     Rate and Rhythm: Normal rate and regular rhythm.  Pulmonary:     Effort: Pulmonary effort is normal.     Breath sounds: Normal breath sounds.  Musculoskeletal:        General: Normal range of motion.     Cervical back: Neck supple.  Skin:    General: Skin is warm and dry.  Neurological:     Cranial Nerves: No cranial nerve deficit.     Coordination: Coordination normal.           Assessment & Plan:  Sleep disturbance Assessment & Plan: Appears physical, not psychological.  Reviewed Zyrtec side effects which does mention twitching. Will discontinue this for 2 weeks. She will update if no improvement. Consider treatment for lower back pain with gabapentin 100-200 mg HS. Consider PT for lower back pain. Consider neurology referral.  Await update.  Labs reviewed from GYN from a few days ago.         Doreene NestKatherine K Tylan Kinn, NP

## 2023-02-24 NOTE — Patient Instructions (Signed)
Stop taking Zyrtec nightly for allergies.  Please update me via MyChart in 2 weeks.  It was a pleasure to see you today!

## 2023-02-24 NOTE — Assessment & Plan Note (Signed)
Appears physical, not psychological.  Reviewed Zyrtec side effects which does mention twitching. Will discontinue this for 2 weeks. She will update if no improvement. Consider treatment for lower back pain with gabapentin 100-200 mg HS. Consider PT for lower back pain. Consider neurology referral.  Await update.  Labs reviewed from GYN from a few days ago.

## 2023-03-15 ENCOUNTER — Other Ambulatory Visit: Payer: Self-pay | Admitting: Primary Care

## 2023-03-15 DIAGNOSIS — F411 Generalized anxiety disorder: Secondary | ICD-10-CM

## 2023-03-15 MED ORDER — BUSPIRONE HCL 5 MG PO TABS
5.0000 mg | ORAL_TABLET | Freq: Two times a day (BID) | ORAL | 0 refills | Status: DC
Start: 2023-03-15 — End: 2024-03-17

## 2023-03-15 NOTE — Telephone Encounter (Signed)
From: Heloise Purpura To: Office of Doreene Nest, NP Sent: 03/14/2023 11:38 AM EDT Subject: Medication Renewal Request  Refills have been requested for the following medications:   busPIRone (BUSPAR) 5 MG tablet [Davina Howlett K [place]]  Preferred pharmacy: [id number] Judithann Sheen, [place] - [address] Delivery method: Daryll Drown

## 2023-03-18 DIAGNOSIS — G479 Sleep disorder, unspecified: Secondary | ICD-10-CM

## 2023-03-19 MED ORDER — GABAPENTIN 100 MG PO CAPS
100.0000 mg | ORAL_CAPSULE | Freq: Every day | ORAL | 0 refills | Status: DC
Start: 2023-03-19 — End: 2023-05-01

## 2023-04-29 ENCOUNTER — Other Ambulatory Visit: Payer: Self-pay | Admitting: Primary Care

## 2023-04-29 DIAGNOSIS — F419 Anxiety disorder, unspecified: Secondary | ICD-10-CM

## 2023-04-29 DIAGNOSIS — G479 Sleep disorder, unspecified: Secondary | ICD-10-CM

## 2023-04-30 ENCOUNTER — Other Ambulatory Visit: Payer: Self-pay | Admitting: Primary Care

## 2023-04-30 DIAGNOSIS — F419 Anxiety disorder, unspecified: Secondary | ICD-10-CM

## 2023-05-01 MED ORDER — VENLAFAXINE HCL ER 75 MG PO CP24
75.0000 mg | ORAL_CAPSULE | Freq: Every day | ORAL | 0 refills | Status: DC
Start: 2023-05-01 — End: 2023-07-01

## 2023-06-14 LAB — HM MAMMOGRAPHY

## 2023-06-29 ENCOUNTER — Encounter (INDEPENDENT_AMBULATORY_CARE_PROVIDER_SITE_OTHER): Payer: BC Managed Care – PPO

## 2023-06-29 DIAGNOSIS — M545 Low back pain, unspecified: Secondary | ICD-10-CM

## 2023-06-29 DIAGNOSIS — F32A Depression, unspecified: Secondary | ICD-10-CM

## 2023-06-30 DIAGNOSIS — G8929 Other chronic pain: Secondary | ICD-10-CM | POA: Diagnosis not present

## 2023-06-30 DIAGNOSIS — M545 Low back pain, unspecified: Secondary | ICD-10-CM | POA: Diagnosis not present

## 2023-06-30 MED ORDER — GABAPENTIN 300 MG PO CAPS
300.0000 mg | ORAL_CAPSULE | Freq: Every day | ORAL | 0 refills | Status: DC
Start: 2023-06-30 — End: 2023-10-03

## 2023-07-01 ENCOUNTER — Other Ambulatory Visit: Payer: Self-pay | Admitting: Primary Care

## 2023-07-01 DIAGNOSIS — F32A Depression, unspecified: Secondary | ICD-10-CM

## 2023-07-01 MED ORDER — CITALOPRAM HYDROBROMIDE 20 MG PO TABS
20.0000 mg | ORAL_TABLET | Freq: Every day | ORAL | 0 refills | Status: DC
Start: 2023-07-01 — End: 2023-09-27

## 2023-07-01 MED ORDER — VENLAFAXINE HCL ER 37.5 MG PO CP24
37.5000 mg | ORAL_CAPSULE | Freq: Every day | ORAL | 0 refills | Status: DC
Start: 2023-07-01 — End: 2023-08-03

## 2023-07-01 NOTE — Telephone Encounter (Signed)

## 2023-07-23 ENCOUNTER — Other Ambulatory Visit: Payer: Self-pay | Admitting: Primary Care

## 2023-07-23 DIAGNOSIS — F419 Anxiety disorder, unspecified: Secondary | ICD-10-CM

## 2023-08-03 ENCOUNTER — Ambulatory Visit (INDEPENDENT_AMBULATORY_CARE_PROVIDER_SITE_OTHER): Payer: BC Managed Care – PPO | Admitting: Primary Care

## 2023-08-03 ENCOUNTER — Encounter: Payer: Self-pay | Admitting: Primary Care

## 2023-08-03 VITALS — BP 126/78 | HR 84 | Temp 97.3°F | Ht 70.0 in | Wt 165.0 lb

## 2023-08-03 DIAGNOSIS — Z0001 Encounter for general adult medical examination with abnormal findings: Secondary | ICD-10-CM

## 2023-08-03 DIAGNOSIS — Z23 Encounter for immunization: Secondary | ICD-10-CM

## 2023-08-03 DIAGNOSIS — E785 Hyperlipidemia, unspecified: Secondary | ICD-10-CM

## 2023-08-03 DIAGNOSIS — F32A Depression, unspecified: Secondary | ICD-10-CM

## 2023-08-03 DIAGNOSIS — G8929 Other chronic pain: Secondary | ICD-10-CM | POA: Diagnosis not present

## 2023-08-03 DIAGNOSIS — G479 Sleep disorder, unspecified: Secondary | ICD-10-CM

## 2023-08-03 DIAGNOSIS — R519 Headache, unspecified: Secondary | ICD-10-CM

## 2023-08-03 DIAGNOSIS — F419 Anxiety disorder, unspecified: Secondary | ICD-10-CM | POA: Diagnosis not present

## 2023-08-03 DIAGNOSIS — M545 Low back pain, unspecified: Secondary | ICD-10-CM

## 2023-08-03 NOTE — Progress Notes (Signed)
Subjective:    Patient ID: Kathy Sanders, female    DOB: [birth year], 42 y.o.   MRN: [id number]  HPI  Kathy Sanders is a very pleasant 42 y.o. female who presents today for complete physical and follow up of chronic conditions.  She would also like to discuss chronic back pain. Her pain is located to the mid lower lumbar spine which has been intermittent and chronic for years. Over the last year her pain has become more bothersome. Her pain is worse with sitting and standing for prolonged periods of time. She's been seeing a chiropractor for the last 3 months, no improvement. She underwent xray of the lumbar spine recently which showed decreased to the cartilage between the vertebra. She has not tried physical therapy.    Immunizations: -Tetanus: Completed in 2018 -Influenza: Influenza vaccine provided today.   Diet: Fair diet.  Exercise: No regular exercise.  Eye exam: Completes annually  Dental exam: Completes semi-annually    Pap Smear: Completed in 2022 Mammogram: Completed at GYN office in July 2024  BP Readings from Last 3 Encounters:  08/03/23 126/78  02/24/23 124/86  02/22/23 105/74        Review of Systems  Constitutional:  Negative for unexpected weight change.  HENT:  Negative for rhinorrhea.   Respiratory:  Negative for cough and shortness of breath.   Cardiovascular:  Negative for chest pain.  Gastrointestinal:  Negative for constipation and diarrhea.  Genitourinary:  Negative for difficulty urinating.  Musculoskeletal:  Positive for arthralgias and back pain.  Skin:  Negative for rash.  Allergic/Immunologic: Negative for environmental allergies.  Neurological:  Negative for dizziness and headaches.  Psychiatric/Behavioral:  The patient is not nervous/anxious.          Past Medical History:  Diagnosis Date   Acute right ankle pain 11/06/2020   ADHD (attention deficit hyperactivity disorder)    ADD   Anxiety    Chicken pox    COVID    x  2 -   COVID-19 08/2020   Pneumonia    walking pneumonia    Social History   Socioeconomic History   Marital status: Married    Spouse name: Not on file   Number of children: 1   Years of education: Not on file   Highest education level: Bachelor's degree (e.g., BA, AB, BS)  Occupational History   Occupation: Runner, broadcasting/film/video (Social research officer, government)  Tobacco Use   Smoking status: Never    Passive exposure: Never   Smokeless tobacco: Never  Vaping Use   Vaping status: Never Used  Substance and Sexual Activity   Alcohol use: Yes    Alcohol/week: 2.0 standard drinks of alcohol    Types: 2 Glasses of wine per week   Drug use: Never   Sexual activity: Not on file  Other Topics Concern   Not on file  Social History Narrative   Not on file   Social Determinants of Health   Financial Resource Strain: Low Risk  (02/20/2023)   Overall Financial Resource Strain (CARDIA)    Difficulty of Paying Living Expenses: Not very hard  Food Insecurity: No Food Insecurity (02/20/2023)   Hunger Vital Sign    Worried About Running Out of Food in the Last Year: Never true    Ran Out of Food in the Last Year: Never true  Transportation Needs: No Transportation Needs (02/20/2023)   PRAPARE - Administrator, Civil Service (Medical): No    Lack of Transportation (Non-Medical): No  Physical Activity: Unknown (02/20/2023)   Exercise Vital Sign    Days of Exercise per Week: 0 days    Minutes of Exercise per Session: Not on file  Stress: Stress Concern Present (02/20/2023)   [employer] of Occupational Health - Occupational Stress Questionnaire    Feeling of Stress : To some extent  Social Connections: Socially Isolated (02/20/2023)   Social Connection and Isolation Panel [NHANES]    Frequency of Communication with Friends and Family: Twice a week    Frequency of Social Gatherings with Friends and Family: Never    Attends Religious Services: Never    Database administrator or Organizations: No    Attends  Engineer, structural: Not on file    Marital Status: Married  Catering manager Violence: Not on file    Past Surgical History:  Procedure Laterality Date   CESAREAN SECTION N/A 07/01/2017   Procedure: CESAREAN SECTION;  Surgeon: Ranae Pila, MD;  Location: [facility] BIRTHING SUIT[facility];  Service: Obstetrics;  Laterality: N/A;   COLONOSCOPY  02/18/2021   DILATION AND EVACUATION N/A 02/22/2023   Procedure: DILATATION AND EVACUATION;  Surgeon: Zelphia Cairo, MD;  Location: [facility] OR;  Service: Gynecology;  Laterality: N/A;   OPERATIVE ULTRASOUND N/A 02/22/2023   Procedure: OPERATIVE ULTRASOUND;  Surgeon: Zelphia Cairo, MD;  Location: [facility] OR;  Service: Gynecology;  Laterality: N/A;   WISDOM TOOTH EXTRACTION      Family History  Problem Relation Age of Onset   Hyperlipidemia Father    Alcohol abuse Maternal Uncle    Prostate cancer Maternal Uncle    Cancer Maternal Uncle        head and neck   Breast cancer Maternal Grandmother    Asthma Paternal Grandmother    Depression Mother    Celiac disease Sister    Colon cancer Neg Hx    Colon polyps Neg Hx    Esophageal cancer Neg Hx    Rectal cancer Neg Hx    Stomach cancer Neg Hx     Allergies  Allergen Reactions   Amoxicillin Nausea Only    Has patient had a PCN reaction causing immediate rash, facial/tongue/throat swelling, SOB or lightheadedness with hypotension: No Has patient had a PCN reaction causing severe rash involving mucus membranes or skin necrosis: No Has patient had a PCN reaction that required hospitalization: No Has patient had a PCN reaction occurring within the last 10 years: No If all of the above answers are "NO", then may proceed with Cephalosporin use.     Current Outpatient Medications on File Prior to Visit  Medication Sig Dispense Refill   busPIRone (BUSPAR) 5 MG tablet Take 1 tablet (5 mg total) by mouth 2 (two) times daily. For anxiety 180 tablet 0   cetirizine (ZYRTEC) 10 MG tablet Take 10 mg by  mouth daily.     citalopram (CELEXA) 20 MG tablet Take 1 tablet (20 mg total) by mouth daily. for anxiety and depression. 90 tablet 0   gabapentin (NEURONTIN) 300 MG capsule Take 1 capsule (300 mg total) by mouth at bedtime. For pain and sleep 90 capsule 0   mometasone (ELOCON) 0.1 % cream Apply topically 2 (two) times daily as needed. 45 g 0   Multiple Vitamins-Minerals (ALIVE WOMENS GUMMY) CHEW Chew 2 capsules by mouth daily.     ketoconazole (NIZORAL) 2 % cream Apply twice weekly to scalp as needed. (Patient not taking: Reported on 02/18/2023) 30 g 0   No current facility-administered medications on file  prior to visit.    BP 126/78   Pulse 84   Temp (!) 97.3 F (36.3 C) (Temporal)   Ht 5\' 10"  (1.778 m)   Wt 165 lb (74.8 kg)   LMP 07/05/2023   SpO2 95%   BMI 23.68 kg/m  Objective:   Physical Exam HENT:     Right Ear: Tympanic membrane and ear canal normal.     Left Ear: Tympanic membrane and ear canal normal.     Nose: Nose normal.  Eyes:     Conjunctiva/sclera: Conjunctivae normal.     Pupils: Pupils are equal, round, and reactive to light.  Neck:     Thyroid: No thyromegaly.  Cardiovascular:     Rate and Rhythm: Normal rate and regular rhythm.     Heart sounds: No murmur heard. Pulmonary:     Effort: Pulmonary effort is normal.     Breath sounds: Normal breath sounds. No rales.  Abdominal:     General: Bowel sounds are normal.     Palpations: Abdomen is soft.     Tenderness: There is no abdominal tenderness.  Musculoskeletal:        General: Normal range of motion.     Cervical back: Neck supple.     Lumbar back: Tenderness present. Normal range of motion. Negative right straight leg raise test and negative left straight leg raise test.       Back:  Lymphadenopathy:     Cervical: No cervical adenopathy.  Skin:    General: Skin is warm and dry.     Findings: No rash.  Neurological:     Mental Status: She is alert and oriented to person, place, and time.      Cranial Nerves: No cranial nerve deficit.     Deep Tendon Reflexes: Reflexes are normal and symmetric.  Psychiatric:        Mood and Affect: Mood normal.           Assessment & Plan:  Encounter for annual general medical examination with abnormal findings in adult Assessment & Plan: Immunizations UTD. Influenza vaccine provided today.  Pap smear UTD. Follows with GYN Mammogram UTD  Discussed the importance of a healthy diet and regular exercise in order for weight loss, and to reduce the risk of further co-morbidity.  Exam stable. Labs pending.  Follow up in 1 year for repeat physical.    Anxiety and depression Assessment & Plan: Improving.  Continue citalopram 20 mg daily.  Continue Buspirone 5 mg BID PRN.  Continue to monitor for sexual side effects.    Frequent headaches Assessment & Plan: Controlled.  Continue to monitor. Remain off treatment.    Chronic midline low back pain without sciatica Assessment & Plan: Discussed options for treatment. Fortunately, no alarm signs on exam.   Referral placed for physical therapy.    Orders: -     Ambulatory referral to Physical Therapy  Hyperlipidemia, unspecified hyperlipidemia type -     Lipid panel; Future -     Comprehensive metabolic panel; Future -     TSH; Future  Encounter for immunization -     Flu vaccine trivalent PF, 6mos and older(Flulaval,Afluria,Fluarix,Fluzone)  Sleep disturbance Assessment & Plan: Stable.  Continue gabapentin 300 mg HS.         Doreene Nest, NP

## 2023-08-03 NOTE — Patient Instructions (Signed)
You will either be contacted via phone regarding your referral to physical therapy, or you may receive a letter on your MyChart portal from our referral team with instructions for scheduling an appointment. Please let us know if you have not been contacted by anyone within two weeks.  Schedule a lab only appointment to return when you have been fasting.  It was a pleasure to see you today!

## 2023-08-03 NOTE — Assessment & Plan Note (Signed)
Discussed options for treatment. Fortunately, no alarm signs on exam.   Referral placed for physical therapy.

## 2023-08-03 NOTE — Assessment & Plan Note (Signed)
Immunizations UTD. Influenza vaccine provided today. Pap smear UTD. Follows with GYN Mammogram UTD.   Discussed the importance of a healthy diet and regular exercise in order for weight loss, and to reduce the risk of further co-morbidity.  Exam stable. Labs pending.  Follow up in 1 year for repeat physical.

## 2023-08-03 NOTE — Assessment & Plan Note (Signed)
Controlled.  Continue to monitor. Remain off treatment.

## 2023-08-03 NOTE — Assessment & Plan Note (Signed)
Stable. Continue gabapentin 300mg  HS.

## 2023-08-03 NOTE — Assessment & Plan Note (Addendum)
Improving.  Continue citalopram 20 mg daily.  Continue Buspirone 5 mg BID PRN.  Continue to monitor for sexual side effects.

## 2023-08-11 ENCOUNTER — Other Ambulatory Visit (INDEPENDENT_AMBULATORY_CARE_PROVIDER_SITE_OTHER): Payer: BC Managed Care – PPO

## 2023-08-11 DIAGNOSIS — E785 Hyperlipidemia, unspecified: Secondary | ICD-10-CM | POA: Diagnosis not present

## 2023-08-11 LAB — LIPID PANEL
Cholesterol: 181 mg/dL (ref 0–200)
HDL: 61.1 mg/dL (ref >39.00)
LDL Cholesterol: 100 mg/dL — ABNORMAL HIGH (ref 0–99)
NonHDL: 120.01
Total CHOL/HDL Ratio: 3
Triglycerides: 99 mg/dL (ref 0.0–149.0)
VLDL: 19.8 mg/dL (ref 0.0–40.0)

## 2023-08-11 LAB — COMPREHENSIVE METABOLIC PANEL
ALT: 10 U/L (ref 0–35)
AST: 12 U/L (ref 0–37)
Albumin: 4 g/dL (ref 3.5–5.2)
Alkaline Phosphatase: 42 U/L (ref 39–117)
BUN: 9 mg/dL (ref 6–23)
CO2: 30 mEq/L (ref 19–32)
Calcium: 9.2 mg/dL (ref 8.4–10.5)
Chloride: 100 mEq/L (ref 96–112)
Creatinine, Ser: 0.78 mg/dL (ref 0.40–1.20)
GFR: 92.53 mL/min (ref >60.00)
Glucose, Bld: 87 mg/dL (ref 70–99)
Potassium: 4.8 mEq/L (ref 3.5–5.1)
Sodium: 137 mEq/L (ref 135–145)
Total Bilirubin: 0.6 mg/dL (ref 0.2–1.2)
Total Protein: 6.8 g/dL (ref 6.0–8.3)

## 2023-08-11 LAB — TSH: TSH: 1.98 u[IU]/mL (ref 0.35–5.50)

## 2023-08-18 ENCOUNTER — Other Ambulatory Visit: Payer: Self-pay

## 2023-08-18 ENCOUNTER — Ambulatory Visit: Payer: BC Managed Care – PPO | Admitting: Physical Therapy

## 2023-08-18 ENCOUNTER — Encounter: Payer: Self-pay | Admitting: Physical Therapy

## 2023-08-18 DIAGNOSIS — M5459 Other low back pain: Secondary | ICD-10-CM

## 2023-08-18 DIAGNOSIS — M6281 Muscle weakness (generalized): Secondary | ICD-10-CM

## 2023-08-18 NOTE — Therapy (Addendum)
OUTPATIENT PHYSICAL THERAPY THORACOLUMBAR EVALUATION  / DISCHARGE   Patient Name: Kathy Sanders MRN: [id number] DOB:Sep 10, 1981, 42 y.o., female Today's Date: 08/18/2023  END OF SESSION:  PT End of Session - 08/18/23 1630     Visit Number 1    Number of Visits 10    Date for PT Re-Evaluation 10/13/23    PT Start Time 1600    PT Stop Time 1650    PT Time Calculation (min) 50 min    Activity Tolerance Patient tolerated treatment well    Behavior During Therapy [facility] for tasks assessed/performed             Past Medical History:  Diagnosis Date   Acute right ankle pain 11/06/2020   ADHD (attention deficit hyperactivity disorder)    ADD   Anxiety    Chicken pox    COVID    x 2 -   COVID-19 08/2020   Pneumonia    walking pneumonia   Past Surgical History:  Procedure Laterality Date   CESAREAN SECTION N/A 07/01/2017   Procedure: CESAREAN SECTION;  Surgeon: Ranae Pila, MD;  Location: [facility] BIRTHING SUIT[facility];  Service: Obstetrics;  Laterality: N/A;   COLONOSCOPY  02/18/2021   DILATION AND EVACUATION N/A 02/22/2023   Procedure: DILATATION AND EVACUATION;  Surgeon: Zelphia Cairo, MD;  Location: [facility] OR;  Service: Gynecology;  Laterality: N/A;   OPERATIVE ULTRASOUND N/A 02/22/2023   Procedure: OPERATIVE ULTRASOUND;  Surgeon: Zelphia Cairo, MD;  Location: [facility] OR;  Service: Gynecology;  Laterality: N/A;   WISDOM TOOTH EXTRACTION     Patient Active Problem List   Diagnosis Date Noted   Sleep disturbance 02/24/2023   Seborrheic dermatitis of scalp 10/15/2022   Overweight (BMI 25.0-29.9) 05/28/2022   Hearing problem of both ears 05/28/2022   Chronic low back pain 08/17/2019   Dyshidrotic eczema 06/15/2018   Encounter for annual general medical examination with abnormal findings in adult 11/14/2015   Rash and nonspecific skin eruption 11/14/2015   Anxiety and depression 07/05/2015   Frequent headaches 07/05/2015    PCP: Doreene Nest, NP  REFERRING PROVIDER:  Doreene Nest, NP  REFERRING DIAG: M54.50,G89.29 (ICD-10-CM) - Chronic midline low back pain without sciatica  Rationale for Evaluation and Treatment: Rehabilitation  THERAPY DIAG:  Other low back pain  Muscle weakness (generalized)  ONSET DATE: Chronic pain for years  SUBJECTIVE:                                                                                                                                                                                           SUBJECTIVE STATEMENT: Her pain is located to  the mid lower lumbar spine which has been intermittent and chronic for years without any injury or known cause. Over the last year her pain has become more bothersome. Her pain is worse with sitting and standing for prolonged periods of time. She's been seeing a chiropractor for the last 3 months, no improvement. She has not tried physical therapy.  PERTINENT HISTORY:  Chronic back pain  PAIN:  Are you having pain? Yes: NPRS scale: 5/10 Pain location: low back in center with some pain radiating around each side Pain description: sharp Aggravating factors: bending over, sitting or standing for prolonged periods of time,  Relieving factors: laying supine on hard floor over lumbar stretcher  PRECAUTIONS: None  RED FLAGS: None   WEIGHT BEARING RESTRICTIONS: No  FALLS:  Has patient fallen in last 6 months? No   OCCUPATION: Editor, commissioning  PLOF: Independent  PATIENT GOALS: reduce pain  NEXT MD VISIT:   OBJECTIVE:  Note: Objective measures were completed at Evaluation unless otherwise noted.  DIAGNOSTIC FINDINGS:  Per NP note "She underwent xray of the lumbar spine recently which showed decreased cartilage between the vertebra. "  PATIENT SURVEYS:  FOTO 63% functional intake, goal is 72  SCREENING FOR RED FLAGS: Bowel or bladder incontinence: No Cauda equina syndrome: No  COGNITION: Overall cognitive status: Within functional limits for tasks  assessed     SENSATION: WFL  MUSCLE LENGTH: Hamstrings: mildly tight bilat Lumbar paraspinals, mild tight on Rt and moderate tight on left   POSTURE: No Significant postural limitations  PALPATION: Tender to palpation with PAM to L2-5, and lumbar paraspinals  LUMBAR ROM:   AROM eval  Flexion 75% and pain  Extension 50%   Right lateral flexion WNL  Left lateral flexion WNL  Right rotation WNL  Left rotation WNL   (Blank rows = not tested)  LOWER EXTREMITY ROM:   WNL   LOWER EXTREMITY MMT:  WNL 5/5 grossly bilat    LUMBAR SPECIAL TESTS:  Prone instability test: Positive, Straight leg raise test: Negative, and Slump test: Positive  GAIT: Comments: WNL  TODAY'S TREATMENT:  Eval HEP creation and review with demonstration and trial set preformed, see below for details Manual therapy for active compression and skilled palpation and Trigger Point Dry-Needling  Treatment instructions: Expect mild to moderate muscle soreness. Patient Consent Given: Yes Education handout provided: verbally provided Muscles treated: lumbar paraspinals and multifidi Estim combined: Yes used micro amp current at 100 frequency to level tolerated as well as milli amp current at frequency 2 to level tolerated Treatment response/outcome: good overall tolerance,twitch response noted   Mechanical lumbar traction 70-55 # intermittent X 10 min     PATIENT EDUCATION: Education details: HEP, PT plan of care Person educated: Patient Education method: Explanation, Demonstration, Verbal cues, and Handouts Education comprehension: verbalized understanding and needs further education   HOME EXERCISE PROGRAM: Access Code: [email or web address] URL: [email or web address] Date: 08/18/2023 Prepared by: Ivery Quale  Exercises - Standing Lumbar Extension  - 2 x daily - 6 x weekly - 2 sets - 10 reps - 5 hold - Seated Slump Nerve Glide  - 2 x daily - 6 x weekly - 1 sets - 10 reps - 3 hold - Bird  Dog  - 2 x daily - 6 x weekly - 1 sets - 10 reps - 5 sec hold - Supine Dead Bug with Leg Extension  - 2 x daily - 6 x weekly - 3 sets - 10 reps - Prone Double Leg Lift  -  2 x daily - 6 x weekly - 3 sets - 10 reps - Half Hollow Hold With Legs Only  - 2 x daily - 6 x weekly - 1-2 sets - 10 reps - 5 sec hold  ASSESSMENT:  CLINICAL IMPRESSION: Patient referred to PT for chronic low back pain. She has decreased ROM for lumbar flexion and extension, muscular tension and instability noted with prone instability test. I did try DN today combined with E stim as well as mechanical lumbar traction today to see if this helps with her symptoms and provided her with lumbar/core program to try at home. Patient will benefit from skilled PT to address below impairments, limitations and improve overall function.  OBJECTIVE IMPAIRMENTS: decreased activity tolerance,  decreased mobility, decreased ROM, decreased core/lumbar strength, pain  ACTIVITY LIMITATIONS: bending, lifting, carry, cleaning, or occupation  PERSONAL FACTORS: chronic nature of pain are also affecting patient's functional outcome.  REHAB POTENTIAL: Good  CLINICAL DECISION MAKING: Stable/uncomplicated  EVALUATION COMPLEXITY: Low    GOALS: Short term PT Goals Target date: 09/15/2023   Pt will be I and compliant with HEP. Baseline:  Goal status: New Pt will decrease pain by 25% overall Baseline: Goal status: New  Long term PT goals Target date:10/13/2023   Pt will improve lumbar ROM to WNL for flexion and extension without complaints of pain to improve functional mobility Baseline: Goal status: New Pt will improve FOTO to at least 72% functional to show improved function Baseline: Goal status: New Pt will reduce pain by overall 50% overall with usual activity Baseline: Goal status: New  PLAN: PT FREQUENCY: 1-2 times per week   PT DURATION: 4-8 weeks  PLANNED INTERVENTIONS (unless contraindicated): aquatic PT, Canalith  repositioning, cryotherapy, Electrical stimulation, Iontophoresis with 4 mg/ml dexamethasome, Moist heat, traction, Ultrasound, gait training, Therapeutic exercise, balance training, neuromuscular re-education, patient/family education, prosthetic training, manual techniques, passive ROM, dry needling, taping, vasopnuematic device, vestibular, spinal manipulations, joint manipulations  PLAN FOR NEXT SESSION: how was DN and traction from last time and repeat if desired. How is HEP going and progress lumbar/core strength as tolerated    April Manson, PT,DPT 08/18/2023, 4:48 PM   PHYSICAL THERAPY DISCHARGE SUMMARY  Visits from Start of Care: 1  Current functional level related to goals / functional outcomes: See note   Remaining deficits: See note   Education / Equipment: HEP  Patient goals were not met. Patient is being discharged due to not returning since the last visit.  Chyrel Masson, PT, DPT, OCS, ATC 11/11/23  2:37 PM

## 2023-09-27 ENCOUNTER — Other Ambulatory Visit: Payer: Self-pay | Admitting: Primary Care

## 2023-09-27 DIAGNOSIS — F419 Anxiety disorder, unspecified: Secondary | ICD-10-CM

## 2023-10-02 ENCOUNTER — Other Ambulatory Visit: Payer: Self-pay | Admitting: Primary Care

## 2023-10-02 DIAGNOSIS — M545 Low back pain, unspecified: Secondary | ICD-10-CM

## 2023-11-01 ENCOUNTER — Encounter (INDEPENDENT_AMBULATORY_CARE_PROVIDER_SITE_OTHER): Payer: Self-pay

## 2023-11-01 DIAGNOSIS — F419 Anxiety disorder, unspecified: Secondary | ICD-10-CM

## 2023-11-02 DIAGNOSIS — F32A Depression, unspecified: Secondary | ICD-10-CM | POA: Diagnosis not present

## 2023-11-02 DIAGNOSIS — F419 Anxiety disorder, unspecified: Secondary | ICD-10-CM | POA: Diagnosis not present

## 2023-11-05 MED ORDER — CITALOPRAM HYDROBROMIDE 40 MG PO TABS
40.0000 mg | ORAL_TABLET | Freq: Every day | ORAL | 0 refills | Status: DC
Start: 2023-11-05 — End: 2024-02-02

## 2023-11-05 NOTE — Telephone Encounter (Signed)
Please see the MyChart message reply(ies) for my assessment and plan.  The patient gave consent for this Medical Advice Message and is aware that it may result in a bill to their insurance company as well as the possibility that this may result in a co-payment or deductible. They are an established patient, but are not seeking medical advice exclusively about a problem treated during an in person or video visit in the last 7 days. I did not recommend an in person or video visit within 7 days of my reply.  I spent a total of 10 minutes cumulative time within 7 days through MyChart messaging Delaney Perona K Shivank Pinedo, NP  

## 2024-02-02 ENCOUNTER — Other Ambulatory Visit: Payer: Self-pay | Admitting: Primary Care

## 2024-02-02 DIAGNOSIS — F419 Anxiety disorder, unspecified: Secondary | ICD-10-CM

## 2024-03-16 ENCOUNTER — Other Ambulatory Visit: Payer: Self-pay | Admitting: Primary Care

## 2024-03-16 DIAGNOSIS — F411 Generalized anxiety disorder: Secondary | ICD-10-CM

## 2024-05-10 ENCOUNTER — Ambulatory Visit (INDEPENDENT_AMBULATORY_CARE_PROVIDER_SITE_OTHER): Admitting: Primary Care

## 2024-05-10 ENCOUNTER — Encounter: Payer: Self-pay | Admitting: Primary Care

## 2024-05-10 ENCOUNTER — Ambulatory Visit
Admission: RE | Admit: 2024-05-10 | Discharge: 2024-05-10 | Disposition: A | Discharge status: Home / Self Care | Source: Ambulatory Visit | Attending: Primary Care

## 2024-05-10 VITALS — BP 110/76 | HR 68 | Temp 97.2°F | Ht 70.0 in | Wt 156.0 lb

## 2024-05-10 DIAGNOSIS — G8929 Other chronic pain: Secondary | ICD-10-CM | POA: Diagnosis not present

## 2024-05-10 DIAGNOSIS — L219 Seborrheic dermatitis, unspecified: Secondary | ICD-10-CM

## 2024-05-10 DIAGNOSIS — R41840 Attention and concentration deficit: Secondary | ICD-10-CM | POA: Insufficient documentation

## 2024-05-10 DIAGNOSIS — M545 Low back pain, unspecified: Secondary | ICD-10-CM

## 2024-05-10 MED ORDER — CYCLOBENZAPRINE HCL 5 MG PO TABS
5.0000 mg | ORAL_TABLET | Freq: Three times a day (TID) | ORAL | 0 refills | Status: AC | PRN
Start: 2024-05-10 — End: ?

## 2024-05-10 MED ORDER — KETOCONAZOLE 2 % EX SHAM
1.0000 | MEDICATED_SHAMPOO | CUTANEOUS | 0 refills | Status: AC
Start: 2024-05-11 — End: ?

## 2024-05-10 MED ORDER — BUPROPION HCL 75 MG PO TABS
75.0000 mg | ORAL_TABLET | Freq: Every day | ORAL | 0 refills | Status: DC
Start: 2024-05-10 — End: 2024-08-05

## 2024-05-10 NOTE — Assessment & Plan Note (Signed)
 Or psoriasis. Exam today benign.   Refill provided for ketoconazole  2% shampoo to use.SABRA

## 2024-05-10 NOTE — Assessment & Plan Note (Signed)
 Acute on chronic flare. Fortunately, she is improving.  Will obtain plain films of the lumbar spine today. Prescription for cyclobenzaprine 5 mg tablets sent to pharmacy to use as needed.  Drowsiness precautions provided.

## 2024-05-10 NOTE — Assessment & Plan Note (Signed)
 Discussed options, if she would like to start stimulant treatment then she will need a formal diagnosis of ADHD per retesting. We discussed off-label use of Wellbutrin  to see if this helps.  She opts for a trial of Wellbutrin .  Continue citalopram  40 mg daily. Start bupropion  75 mg daily.  Discontinue buspirone .

## 2024-05-10 NOTE — Progress Notes (Signed)
 Subjective:    Patient ID: Kathy Sanders, female    DOB: 16-Aug-1981, 43 y.o.   MRN: [id number]  Back Pain Pertinent negatives include no chest pain.    Kathy Sanders is a very pleasant 43 y.o. female with a history of chronic low back pain who presents today to discuss back pain and several concerns.   1) Chronic Back Pain: Her back pain is located to the bilateral lower back for which is chronic. Increased pain four days while bending forward to start mopping. She rested for a bit, got up to walk, then noticed the pain again. She took an oxycodone  from a prior surgery and Iburpofen that day. Since then her pain has improved slowly.   She denies radiation of pain, lower extremity numbness. She was referred to PT last year, only attended one session due to the cost. She has not been doing home PT exercises.   2) Dandruff: Chronic for the last 2 years to the left parietal scalp. She was once prescribed a shampoo which helps. She needs a new prescription as her symptoms have returned.   3) Difficulty Concentrating: Diagnosed in high school initially with ADHD. Never treated with medication.  Believes that her ADHD has returned.  Symptoms include difficulty focusing, cannot think fully, difficulty concentrating with background noise, increased irritability. She is managed on citalopram  40 mg daily and buspirone  5 mg BID.   Her son was recently diagnosed with ADHD.  She does believe that her anxiety is well-controlled on citalopram  40 mg daily.  She uses buspirone  as needed.  Review of Systems  Respiratory:  Negative for shortness of breath.   Cardiovascular:  Negative for chest pain.  Musculoskeletal:  Positive for back pain and myalgias.  Skin:        Dandruff   Psychiatric/Behavioral:  Positive for decreased concentration. The patient is nervous/anxious.          Past Medical History:  Diagnosis Date   Acute right ankle pain 11/06/2020   ADHD (attention deficit  hyperactivity disorder)    ADD   Anxiety    Chicken pox    COVID    x 2 -   COVID-19 08/2020   Pneumonia    walking pneumonia    Social History   Socioeconomic History   Marital status: Married    Spouse name: Not on file   Number of children: 1   Years of education: Not on file   Highest education level: Bachelor's degree (e.g., BA, AB, BS)  Occupational History   Occupation: Runner, broadcasting/film/video (Social research officer, government)  Tobacco Use   Smoking status: Never    Passive exposure: Never   Smokeless tobacco: Never  Vaping Use   Vaping status: Never Used  Substance and Sexual Activity   Alcohol use: Yes    Alcohol/week: 2.0 standard drinks of alcohol    Types: 2 Glasses of wine per week   Drug use: Never   Sexual activity: Not on file  Other Topics Concern   Not on file  Social History Narrative   Not on file   Social Drivers of Health   Financial Resource Strain: Low Risk  (05/06/2024)   Overall Financial Resource Strain (CARDIA)    Difficulty of Paying Living Expenses: Not very hard  Food Insecurity: No Food Insecurity (05/06/2024)   Hunger Vital Sign    Worried About Running Out of Food in the Last Year: Never true    Ran Out of Food in the Last Year: Never true  Transportation Needs: No Transportation Needs (05/06/2024)   PRAPARE - Administrator, Civil Service (Medical): No    Lack of Transportation (Non-Medical): No  Physical Activity: Insufficiently Active (05/06/2024)   Exercise Vital Sign    Days of Exercise per Week: 7 days    Minutes of Exercise per Session: 20 min  Stress: Stress Concern Present (05/06/2024)   [employer] of Occupational Health - Occupational Stress Questionnaire    Feeling of Stress: To some extent  Social Connections: Socially Isolated (05/06/2024)   Social Connection and Isolation Panel    Frequency of Communication with Friends and Family: Once a week    Frequency of Social Gatherings with Friends and Family: Never    Attends Religious  Services: Never    Database administrator or Organizations: No    Attends Engineer, structural: Not on file    Marital Status: Married  Catering manager Violence: Not on file    Past Surgical History:  Procedure Laterality Date   CESAREAN SECTION N/A 07/01/2017   Procedure: CESAREAN SECTION;  Surgeon: Marne Kelly Nest, MD;  Location: [facility] SUIT[facility];  Service: Obstetrics;  Laterality: N/A;   COLONOSCOPY  02/18/2021   DILATION AND EVACUATION N/A 02/22/2023   Procedure: DILATATION AND EVACUATION;  Surgeon: Latisha Medford, MD;  Location: [facility] OR;  Service: Gynecology;  Laterality: N/A;   OPERATIVE ULTRASOUND N/A 02/22/2023   Procedure: OPERATIVE ULTRASOUND;  Surgeon: Latisha Medford, MD;  Location: [facility] OR;  Service: Gynecology;  Laterality: N/A;   WISDOM TOOTH EXTRACTION      Family History  Problem Relation Age of Onset   Hyperlipidemia Father    Alcohol abuse Maternal Uncle    Prostate cancer Maternal Uncle    Cancer Maternal Uncle        head and neck   Breast cancer Maternal Grandmother    Asthma Paternal Grandmother    Depression Mother    Celiac disease Sister    Colon cancer Neg Hx    Colon polyps Neg Hx    Esophageal cancer Neg Hx    Rectal cancer Neg Hx    Stomach cancer Neg Hx     Allergies  Allergen Reactions   Amoxicillin Nausea Only    Has patient had a PCN reaction causing immediate rash, facial/tongue/throat swelling, SOB or lightheadedness with hypotension: No Has patient had a PCN reaction causing severe rash involving mucus membranes or skin necrosis: No Has patient had a PCN reaction that required hospitalization: No Has patient had a PCN reaction occurring within the last 10 years: No If all of the above answers are NO, then may proceed with Cephalosporin use.     Current Outpatient Medications on File Prior to Visit  Medication Sig Dispense Refill   busPIRone  (BUSPAR ) 5 MG tablet TAKE 1 TABLET (5 MG TOTAL) BY MOUTH 2 (TWO) TIMES DAILY.  FOR ANXIETY 180 tablet 0   cetirizine (ZYRTEC) 10 MG tablet Take 10 mg by mouth daily.     citalopram  (CELEXA ) 40 MG tablet TAKE 1 TABLET (40 MG TOTAL) BY MOUTH DAILY. FOR ANXIETY AND DEPRESSION. 90 tablet 1   gabapentin  (NEURONTIN ) 300 MG capsule TAKE 1 CAPSULE (300 MG TOTAL) BY MOUTH AT BEDTIME. FOR PAIN AND SLEEP 90 capsule 2   mometasone  (ELOCON ) 0.1 % cream Apply topically 2 (two) times daily as needed. 45 g 0   Multiple Vitamins-Minerals (ALIVE WOMENS GUMMY) CHEW Chew 2 capsules by mouth daily.     No current facility-administered  medications on file prior to visit.    BP 110/76   Pulse 68   Temp (!) 97.2 F (36.2 C) (Temporal)   Ht 5' 10 (1.778 m)   Wt 156 lb (70.8 kg)   SpO2 98%   BMI 22.38 kg/m  Objective:   Physical Exam  Cardiovascular:     Rate and Rhythm: Normal rate and regular rhythm.  Pulmonary:     Effort: Pulmonary effort is normal.     Breath sounds: Normal breath sounds.   Musculoskeletal:     Cervical back: Neck supple.     Lumbar back: No tenderness or bony tenderness. Normal range of motion. Negative right straight leg raise test and negative left straight leg raise test.       Back:   Skin:    General: Skin is warm and dry.   Neurological:     Mental Status: She is alert and oriented to person, place, and time.   Psychiatric:        Mood and Affect: Mood normal.           Assessment & Plan:  Chronic midline low back pain without sciatica Assessment & Plan: Acute on chronic flare. Fortunately, she is improving.  Will obtain plain films of the lumbar spine today. Prescription for cyclobenzaprine 5 mg tablets sent to pharmacy to use as needed.  Drowsiness precautions provided.  Orders: -     DG Lumbar Spine Complete -     Cyclobenzaprine HCl; Take 1 tablet (5 mg total) by mouth 3 (three) times daily as needed for muscle spasms.  Dispense: 15 tablet; Refill: 0  Difficulty concentrating Assessment & Plan: Discussed options, if she  would like to start stimulant treatment then she will need a formal diagnosis of ADHD per retesting. We discussed off-label use of Wellbutrin  to see if this helps.  She opts for a trial of Wellbutrin .  Continue citalopram  40 mg daily. Start bupropion  75 mg daily.  Discontinue buspirone .  Orders: -     buPROPion  HCl; Take 1 tablet (75 mg total) by mouth daily. For anxiety and focus  Dispense: 90 tablet; Refill: 0  Seborrheic dermatitis of scalp Assessment & Plan: Or psoriasis. Exam today benign.   Refill provided for ketoconazole  2% shampoo to use..  Orders: -     Ketoconazole ; Apply 1 Application topically 2 (two) times a week.  Dispense: 120 mL; Refill: 0        Comer MARLA Gaskins, NP

## 2024-05-10 NOTE — Patient Instructions (Addendum)
 May take cyclobenzaprine muscle relaxer every 8 hours as needed for muscle spasm.  Be careful as this may cause drowsiness.  Complete xray(s) prior to leaving today. I will notify you of your results once received.  Start bupropion  (Wellbutrin ) 75 mg once daily in the morning for focus and anxiety.  Discontinue buspirone  for now.  It was a pleasure to see you today!

## 2024-05-17 ENCOUNTER — Ambulatory Visit: Payer: Self-pay | Admitting: Primary Care

## 2024-06-06 ENCOUNTER — Other Ambulatory Visit: Payer: Self-pay | Admitting: Primary Care

## 2024-06-06 DIAGNOSIS — L219 Seborrheic dermatitis, unspecified: Secondary | ICD-10-CM

## 2024-06-10 ENCOUNTER — Other Ambulatory Visit: Payer: Self-pay | Admitting: Primary Care

## 2024-06-10 DIAGNOSIS — M545 Low back pain, unspecified: Secondary | ICD-10-CM

## 2024-06-11 NOTE — Telephone Encounter (Signed)
 Patient is due for CPE/follow up in mid to late September, this will be required prior to any further refills.  Please schedule, thank you!

## 2024-06-12 NOTE — Telephone Encounter (Signed)
 Lvmtcb, sent mychart message

## 2024-06-13 ENCOUNTER — Other Ambulatory Visit: Payer: Self-pay | Admitting: Primary Care

## 2024-06-13 DIAGNOSIS — F411 Generalized anxiety disorder: Secondary | ICD-10-CM

## 2024-06-13 NOTE — Telephone Encounter (Signed)
 Pt sch cpe for 08/03/24

## 2024-08-03 ENCOUNTER — Encounter: Admitting: Primary Care

## 2024-08-04 ENCOUNTER — Other Ambulatory Visit: Payer: Self-pay | Admitting: Primary Care

## 2024-08-04 DIAGNOSIS — F419 Anxiety disorder, unspecified: Secondary | ICD-10-CM

## 2024-08-05 ENCOUNTER — Other Ambulatory Visit: Payer: Self-pay | Admitting: Primary Care

## 2024-08-05 DIAGNOSIS — R41840 Attention and concentration deficit: Secondary | ICD-10-CM

## 2024-08-07 ENCOUNTER — Ambulatory Visit: Admitting: Primary Care

## 2024-08-07 ENCOUNTER — Encounter: Payer: Self-pay | Admitting: Primary Care

## 2024-08-07 VITALS — BP 102/60 | HR 86 | Temp 97.5°F | Ht 70.0 in | Wt 156.0 lb

## 2024-08-07 DIAGNOSIS — R41840 Attention and concentration deficit: Secondary | ICD-10-CM

## 2024-08-07 DIAGNOSIS — F419 Anxiety disorder, unspecified: Secondary | ICD-10-CM | POA: Diagnosis not present

## 2024-08-07 DIAGNOSIS — F32A Depression, unspecified: Secondary | ICD-10-CM

## 2024-08-07 DIAGNOSIS — E785 Hyperlipidemia, unspecified: Secondary | ICD-10-CM | POA: Diagnosis not present

## 2024-08-07 DIAGNOSIS — M545 Low back pain, unspecified: Secondary | ICD-10-CM | POA: Diagnosis not present

## 2024-08-07 DIAGNOSIS — G8929 Other chronic pain: Secondary | ICD-10-CM

## 2024-08-07 DIAGNOSIS — Z0000 Encounter for general adult medical examination without abnormal findings: Secondary | ICD-10-CM

## 2024-08-07 LAB — LIPID PANEL
Cholesterol: 156 mg/dL (ref 0–200)
HDL: 53.7 mg/dL (ref >39.00)
LDL Cholesterol: 87 mg/dL (ref 0–99)
NonHDL: 102.67
Total CHOL/HDL Ratio: 3
Triglycerides: 80 mg/dL (ref 0.0–149.0)
VLDL: 16 mg/dL (ref 0.0–40.0)

## 2024-08-07 LAB — COMPREHENSIVE METABOLIC PANEL WITH GFR
ALT: 22 U/L (ref 0–35)
AST: 15 U/L (ref 0–37)
Albumin: 4.1 g/dL (ref 3.5–5.2)
Alkaline Phosphatase: 29 U/L — ABNORMAL LOW (ref 39–117)
BUN: 12 mg/dL (ref 6–23)
CO2: 30 meq/L (ref 19–32)
Calcium: 9.4 mg/dL (ref 8.4–10.5)
Chloride: 100 meq/L (ref 96–112)
Creatinine, Ser: 0.89 mg/dL (ref 0.40–1.20)
GFR: 78.43 mL/min (ref >60.00)
Glucose, Bld: 91 mg/dL (ref 70–99)
Potassium: 3.8 meq/L (ref 3.5–5.1)
Sodium: 136 meq/L (ref 135–145)
Total Bilirubin: 0.5 mg/dL (ref 0.2–1.2)
Total Protein: 6.6 g/dL (ref 6.0–8.3)

## 2024-08-07 LAB — CBC
HCT: 39.8 % (ref 36.0–46.0)
Hemoglobin: 13.2 g/dL (ref 12.0–15.0)
MCHC: 33 g/dL (ref 30.0–36.0)
MCV: 94.8 fl (ref 78.0–100.0)
Platelets: 247 K/uL (ref 150.0–400.0)
RBC: 4.2 Mil/uL (ref 3.87–5.11)
RDW: 12.8 % (ref 11.5–15.5)
WBC: 6.1 K/uL (ref 4.0–10.5)

## 2024-08-07 MED ORDER — BUPROPION HCL ER (XL) 150 MG PO TB24
150.0000 mg | ORAL_TABLET | Freq: Every day | ORAL | 3 refills | Status: AC
Start: 2024-08-07 — End: ?

## 2024-08-07 NOTE — Assessment & Plan Note (Signed)
 Immunizations UTD. Influenza vaccine provided today.  Pap smear UTD, follows with GYN Mammogram UTD, follows with GYN Colonoscopy UTD, due 2029  Discussed the importance of a healthy diet and regular exercise in order for weight loss, and to reduce the risk of further co-morbidity.  Exam stable. Labs pending.  Follow up in 1 year for repeat physical.

## 2024-08-07 NOTE — Patient Instructions (Signed)
 Stop by the lab prior to leaving today. I will notify you of your results once received.   Stop taking bupropion  75 mg daily for anxiety.  Start taking bupropion  XL 150 mg once daily for anxiety/depression.  Reduce your citalopram  to 1/2 tablet for 2 to 4 weeks, then stop.  Please update me as discussed.  It was a pleasure to see you today!

## 2024-08-07 NOTE — Progress Notes (Signed)
 Subjective:    Patient ID: Kathy Sanders, female    DOB: [birth year], 43 y.o.   MRN: [id number]  Kathy Sanders is a very pleasant 43 y.o. female who presents today for complete physical and follow up of chronic conditions.  Immunizations: -Tetanus: Completed in 2018 -Influenza: Influenza vaccine provided today.   Diet: Fair diet.  Exercise: Regular exercise, walking dogs  Eye exam: Completed last year  Dental exam: Completes semi-annually    Pap Smear: Completed in 2022 per GYN Mammogram: Completes at GYN office  Colonoscopy: Completed in 2022, due 2029  BP Readings from Last 3 Encounters:  08/07/24 102/60  05/10/24 110/76  08/03/23 126/78     Review of Systems  Constitutional:  Negative for unexpected weight change.  HENT:  Negative for rhinorrhea.   Respiratory:  Negative for cough and shortness of breath.   Cardiovascular:  Negative for chest pain.  Gastrointestinal:  Negative for constipation and diarrhea.  Genitourinary:  Negative for difficulty urinating and menstrual problem.  Musculoskeletal:  Negative for arthralgias and myalgias.  Skin:  Negative for rash.  Allergic/Immunologic: Negative for environmental allergies.  Neurological:  Negative for dizziness, numbness and headaches.  Psychiatric/Behavioral:  The patient is not nervous/anxious.        Improving on bupropion . Would like a dose increase and to wean off citalopram          Past Medical History:  Diagnosis Date   Acute right ankle pain 11/06/2020   ADHD (attention deficit hyperactivity disorder)    ADD   Anxiety    Chicken pox    COVID    x 2 -   COVID-19 08/2020   Pneumonia    walking pneumonia    Social History   Socioeconomic History   Marital status: Married    Spouse name: Not on file   Number of children: 1   Years of education: Not on file   Highest education level: Bachelor's degree (e.g., BA, AB, BS)  Occupational History   Occupation: Runner, broadcasting/film/video (Social research officer, government)   Tobacco Use   Smoking status: Never    Passive exposure: Never   Smokeless tobacco: Never  Vaping Use   Vaping status: Never Used  Substance and Sexual Activity   Alcohol use: Yes    Alcohol/week: 2.0 standard drinks of alcohol    Types: 2 Glasses of wine per week   Drug use: Never   Sexual activity: Not on file  Other Topics Concern   Not on file  Social History Narrative   Not on file   Social Drivers of Health   Financial Resource Strain: Low Risk  (05/06/2024)   Overall Financial Resource Strain (CARDIA)    Difficulty of Paying Living Expenses: Not very hard  Food Insecurity: No Food Insecurity (05/06/2024)   Hunger Vital Sign    Worried About Running Out of Food in the Last Year: Never true    Ran Out of Food in the Last Year: Never true  Transportation Needs: No Transportation Needs (05/06/2024)   PRAPARE - Administrator, Civil Service (Medical): No    Lack of Transportation (Non-Medical): No  Physical Activity: Insufficiently Active (05/06/2024)   Exercise Vital Sign    Days of Exercise per Week: 7 days    Minutes of Exercise per Session: 20 min  Stress: Stress Concern Present (05/06/2024)   [employer] of Occupational Health - Occupational Stress Questionnaire    Feeling of Stress: To some extent  Social Connections: Socially Isolated (05/06/2024)  Social Connection and Isolation Panel    Frequency of Communication with Friends and Family: Once a week    Frequency of Social Gatherings with Friends and Family: Never    Attends Religious Services: Never    Database administrator or Organizations: No    Attends Engineer, structural: Not on file    Marital Status: Married  Catering manager Violence: Not on file    Past Surgical History:  Procedure Laterality Date   CESAREAN SECTION N/A 07/01/2017   Procedure: CESAREAN SECTION;  Surgeon: Marne Kelly Nest, MD;  Location: [facility] BIRTHING SUITES;  Service: Obstetrics;  Laterality: N/A;    COLONOSCOPY  02/18/2021   DILATION AND EVACUATION N/A 02/22/2023   Procedure: DILATATION AND EVACUATION;  Surgeon: Latisha Medford, MD;  Location: [facility] OR;  Service: Gynecology;  Laterality: N/A;   OPERATIVE ULTRASOUND N/A 02/22/2023   Procedure: OPERATIVE ULTRASOUND;  Surgeon: Latisha Medford, MD;  Location: [facility] OR;  Service: Gynecology;  Laterality: N/A;   WISDOM TOOTH EXTRACTION      Family History  Problem Relation Age of Onset   Hyperlipidemia Father    Alcohol abuse Maternal Uncle    Prostate cancer Maternal Uncle    Cancer Maternal Uncle        head and neck   Breast cancer Maternal Grandmother    Asthma Paternal Grandmother    Depression Mother    Celiac disease Sister    Colon cancer Neg Hx    Colon polyps Neg Hx    Esophageal cancer Neg Hx    Rectal cancer Neg Hx    Stomach cancer Neg Hx     Allergies  Allergen Reactions   Amoxicillin Nausea Only    Has patient had a PCN reaction causing immediate rash, facial/tongue/throat swelling, SOB or lightheadedness with hypotension: No Has patient had a PCN reaction causing severe rash involving mucus membranes or skin necrosis: No Has patient had a PCN reaction that required hospitalization: No Has patient had a PCN reaction occurring within the last 10 years: No If all of the above answers are NO, then may proceed with Cephalosporin use.     Current Outpatient Medications on File Prior to Visit  Medication Sig Dispense Refill   Calcium Carbonate (CALCIUM 500 PO) Take by mouth.     cetirizine (ZYRTEC) 10 MG tablet Take 10 mg by mouth daily.     citalopram  (CELEXA ) 40 MG tablet TAKE 1 TABLET (40 MG TOTAL) BY MOUTH DAILY. FOR ANXIETY AND DEPRESSION. 90 tablet 0   cyclobenzaprine  (FLEXERIL ) 5 MG tablet Take 1 tablet (5 mg total) by mouth 3 (three) times daily as needed for muscle spasms. 15 tablet 0   gabapentin  (NEURONTIN ) 300 MG capsule TAKE 1 CAPSULE (300 MG TOTAL) BY MOUTH AT BEDTIME. FOR PAIN AND SLEEP 90 capsule 0    ketoconazole  (NIZORAL ) 2 % shampoo Apply 1 Application topically 2 (two) times a week. 120 mL 0   Multiple Vitamins-Minerals (ALIVE WOMENS GUMMY) CHEW Chew 2 capsules by mouth daily.     busPIRone  (BUSPAR ) 5 MG tablet TAKE 1 TABLET (5 MG TOTAL) BY MOUTH 2 (TWO) TIMES DAILY. FOR ANXIETY (Patient not taking: Reported on 08/07/2024) 180 tablet 0   mometasone  (ELOCON ) 0.1 % cream Apply topically 2 (two) times daily as needed. (Patient not taking: Reported on 08/07/2024) 45 g 0   No current facility-administered medications on file prior to visit.    BP 102/60   Pulse 86   Temp (!) 97.5 F (36.4  C) (Temporal)   Ht 5' 10 (1.778 m)   Wt 156 lb (70.8 kg)   LMP 08/06/2024   SpO2 99%   BMI 22.38 kg/m  Objective:   Physical Exam HENT:     Right Ear: Tympanic membrane and ear canal normal.     Left Ear: Tympanic membrane and ear canal normal.  Eyes:     Pupils: Pupils are equal, round, and reactive to light.  Cardiovascular:     Rate and Rhythm: Normal rate and regular rhythm.  Pulmonary:     Effort: Pulmonary effort is normal.     Breath sounds: Normal breath sounds.  Abdominal:     General: Bowel sounds are normal.     Palpations: Abdomen is soft.     Tenderness: There is no abdominal tenderness.  Musculoskeletal:        General: Normal range of motion.     Cervical back: Neck supple.  Skin:    General: Skin is warm and dry.  Neurological:     Mental Status: She is alert and oriented to person, place, and time.     Cranial Nerves: No cranial nerve deficit.     Deep Tendon Reflexes:     Reflex Scores:      Patellar reflexes are 2+ on the right side and 2+ on the left side. Psychiatric:        Mood and Affect: Mood normal.     Physical Exam        Assessment & Plan:  Preventative health care Assessment & Plan: Immunizations UTD. Influenza vaccine provided today.  Pap smear UTD, follows with GYN Mammogram UTD, follows with GYN Colonoscopy UTD, due 2029  Discussed  the importance of a healthy diet and regular exercise in order for weight loss, and to reduce the risk of further co-morbidity.  Exam stable. Labs pending.  Follow up in 1 year for repeat physical.    Anxiety and depression Assessment & Plan: Improved!  Reduce citalopram  to 20 mg for 2 to 4 weeks, then discontinue. Increase bupropion  and change to XL 150 mg.  Continue BuSpar  5 mg as needed  Orders: -     buPROPion  HCl ER (XL); Take 1 tablet (150 mg total) by mouth daily. For depression and anxiety  Dispense: 90 tablet; Refill: 3  Chronic midline low back pain without sciatica Assessment & Plan: Controlled.  Continue gabapentin  300 mg HS.  Continue cyclobenzaprine  5 mg PRN   Hyperlipidemia, unspecified hyperlipidemia type -     CBC -     Comprehensive metabolic panel with GFR -     Lipid panel  Difficulty concentrating Assessment & Plan: Increase Wellbutrin  XL to 150 mg daily.  She will update.      Assessment and Plan Assessment & Plan         Comer MARLA Gaskins, NP     History of Present Illness

## 2024-08-07 NOTE — Assessment & Plan Note (Signed)
 Controlled.  Continue gabapentin  300 mg HS.  Continue cyclobenzaprine  5 mg PRN

## 2024-08-07 NOTE — Assessment & Plan Note (Signed)
 Increase Wellbutrin  XL to 150 mg daily.  She will update.

## 2024-08-07 NOTE — Assessment & Plan Note (Addendum)
 Improved!  Reduce citalopram  to 20 mg for 2 to 4 weeks, then discontinue. Increase bupropion  and change to XL 150 mg.  Continue BuSpar  5 mg as needed

## 2024-08-08 ENCOUNTER — Ambulatory Visit: Payer: Self-pay | Admitting: Primary Care

## 2024-09-10 ENCOUNTER — Other Ambulatory Visit: Payer: Self-pay | Admitting: Primary Care

## 2024-09-10 DIAGNOSIS — F411 Generalized anxiety disorder: Secondary | ICD-10-CM

## 2024-09-10 DIAGNOSIS — G8929 Other chronic pain: Secondary | ICD-10-CM

## 2024-09-22 DIAGNOSIS — M545 Low back pain, unspecified: Secondary | ICD-10-CM

## 2024-09-22 DIAGNOSIS — R41840 Attention and concentration deficit: Secondary | ICD-10-CM

## 2024-11-13 DIAGNOSIS — G8929 Other chronic pain: Secondary | ICD-10-CM

## 2024-11-14 NOTE — Telephone Encounter (Signed)
 Copied from CRM [id number]. Topic: Medical Record Request - Provider/Facility Request >> Nov 14, 2024  2:28 PM Laymon HERO wrote: Reason for CRM: [facility]- needing demographics Fax # [phone]   Demographics printed and faxed as requested.

## 2024-11-28 ENCOUNTER — Ambulatory Visit: Admitting: Family Medicine

## 2024-11-28 VITALS — BP 110/60 | HR 82 | Temp 97.7°F | Ht 70.0 in | Wt 158.0 lb

## 2024-11-28 DIAGNOSIS — J22 Unspecified acute lower respiratory infection: Secondary | ICD-10-CM | POA: Insufficient documentation

## 2024-11-28 MED ORDER — GUAIFENESIN-CODEINE 100-10 MG/5ML PO SOLN: 5.0000 mL | Freq: Three times a day (TID) | ORAL | 0 refills | Status: AC | PRN

## 2024-11-28 MED ORDER — AZITHROMYCIN 250 MG PO TABS: ORAL_TABLET | ORAL | 0 refills | Status: AC

## 2024-11-28 NOTE — Assessment & Plan Note (Addendum)
 Anticipate ongoing cough due to post-infectious bronchitis from lung inflammation after initial viral infection. Lungs clear, remains afebrile.  Supportive measures reviewed - fluids, rest. Rx codeine  cough syrup with sedation precautions, rec ibuprofen  600mg  2-3 times daily with meals for next 5 days then PRN.  [facility] for azithromycin  printed with indications when to fill (fever >101, worsening productice cough, to cover bacterial or atypical bronchitis).

## 2024-11-28 NOTE — Progress Notes (Signed)
 " Ph: [phone] Fax: [phone]   Patient ID: Kathy Sanders, female    DOB: Mar 22, 1981, 44 y.o.   MRN: [id number]  This visit was conducted in person.  BP 110/60   Pulse 82   Temp 97.7 F (36.5 C) (Oral)   Ht 5' 10 (1.778 m)   Wt 158 lb (71.7 kg)   BMI 22.67 kg/m    CC: congestion Subjective:   HPI: Kathy Sanders is a 44 y.o. female presenting on 11/28/2024 for Cough (Started last week. Had the flu 2 weeks ago. Otc meds not helping. Cough is productive. )   2-3 wks ago developed flu symptoms. Symptoms have progressed to productive cough of colored mucous. Initial body aches, fatigue, congestion that has improved but now cough have persisted. Some coughing fits as well.  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 Cough is affecting her sleep. Feels congested in the throat. No diaphoresis.   Treated with ibuprofen , rest.   No fevers/chills, abd pain, nausea, wheezing, dyspnea.   Other sick contacts initially at home, they're now better.   No h/o asthma, non smoker.  No recent abx use.      Relevant past medical, surgical, family and social history reviewed and updated as indicated. Interim medical history since our last visit reviewed. Allergies and medications reviewed and updated. Outpatient Medications Prior to Visit  Medication Sig Dispense Refill   buPROPion  (WELLBUTRIN  XL) 150 MG 24 hr tablet Take 1 tablet (150 mg total) by mouth daily. For depression and anxiety 90 tablet 3   busPIRone  (BUSPAR ) 5 MG tablet TAKE 1 TABLET (5 MG TOTAL) BY MOUTH 2 (TWO) TIMES DAILY. FOR ANXIETY 180 tablet 2   Calcium Carbonate (CALCIUM 500 PO) Take by mouth.     cetirizine (ZYRTEC) 10 MG tablet Take 10 mg by mouth daily.     cyclobenzaprine  (FLEXERIL ) 5 MG tablet Take 1 tablet (5 mg total) by mouth 3 (three) times daily as needed for muscle spasms. 15 tablet 0   gabapentin  (NEURONTIN ) 300 MG capsule TAKE 1 CAPSULE (300 MG TOTAL)  BY MOUTH AT BEDTIME. FOR PAIN AND SLEEP 90 capsule 2   JUNEL FE 1/20 1-20 MG-MCG tablet Take 1 tablet by mouth daily.     Multiple Vitamins-Minerals (ALIVE WOMENS GUMMY) CHEW Chew 2 capsules by mouth daily.     citalopram  (CELEXA ) 40 MG tablet TAKE 1 TABLET (40 MG TOTAL) BY MOUTH DAILY. FOR ANXIETY AND DEPRESSION. (Patient not taking: Reported on 11/28/2024) 90 tablet 0   ketoconazole  (NIZORAL ) 2 % shampoo Apply 1 Application topically 2 (two) times a week. (Patient not taking: Reported on 11/28/2024) 120 mL 0   mometasone  (ELOCON ) 0.1 % cream Apply topically 2 (two) times daily as needed. (Patient not taking: Reported on 11/28/2024) 45 g 0   No facility-administered medications prior to visit.     Per HPI unless specifically indicated in ROS section below Review of Systems  Objective:  BP 110/60   Pulse 82   Temp 97.7 F (36.5 C) (Oral)   Ht 5' 10 (1.778 m)   Wt 158 lb (71.7 kg)   BMI 22.67 kg/m   Wt Readings from Last 3 Encounters:  11/28/24 158 lb (71.7 kg)  08/07/24 156 lb (70.8 kg)  05/10/24 156 lb (70.8 kg)      Physical Exam Vitals and nursing note reviewed.  Constitutional:      Appearance: Normal appearance. She is not ill-appearing.  HENT:     Head: Normocephalic and atraumatic.     Right Ear: Tympanic membrane, ear canal and external ear normal. There is no impacted cerumen.     Left Ear: Tympanic membrane, ear canal and external ear normal. There is no impacted cerumen.     Nose: Nose normal. No congestion or rhinorrhea.     Mouth/Throat:     Mouth: Mucous membranes are moist.     Pharynx: Oropharynx is clear. No oropharyngeal exudate or posterior oropharyngeal erythema.  Eyes:     Extraocular Movements: Extraocular movements intact.     Conjunctiva/sclera: Conjunctivae normal.     Pupils: Pupils are equal, round, and reactive to light.  Cardiovascular:     Rate and Rhythm: Normal rate and regular rhythm.     Pulses: Normal pulses.     Heart sounds: Normal  heart sounds. No murmur heard. Pulmonary:     Effort: Pulmonary effort is normal. No respiratory distress.     Breath sounds: Normal breath sounds. No wheezing, rhonchi or rales.     Comments: Lungs clear, coughing fits present  Lymphadenopathy:     Head:     Right side of head: No submental, submandibular, tonsillar, preauricular or posterior auricular adenopathy.     Left side of head: No submental, submandibular, tonsillar, preauricular or posterior auricular adenopathy.     Cervical: No cervical adenopathy.     Right cervical: No superficial cervical adenopathy.    Left cervical: No superficial cervical adenopathy.     Upper Body:     Right upper body: No supraclavicular adenopathy.     Left upper body: No supraclavicular adenopathy.  Skin:    Findings: No rash.  Neurological:     Mental Status: She is alert.  Psychiatric:        Mood and Affect: Mood normal.        Behavior: Behavior normal.       Lab Results  Component Value Date   NA 136 08/07/2024   CL 100 08/07/2024   K 3.8 08/07/2024   CO2 30 08/07/2024   BUN 12 08/07/2024   CREATININE 0.89 08/07/2024   GFR 78.43 08/07/2024   CALCIUM 9.4 08/07/2024   ALBUMIN 4.1 08/07/2024   GLUCOSE 91 08/07/2024    Assessment & Plan:   Problem List Items Addressed This Visit     Acute respiratory infection - Primary   Anticipate ongoing cough Sanders to post-infectious bronchitis from lung inflammation after initial viral infection. Lungs clear, remains afebrile.  Supportive measures reviewed - fluids, rest. Rx codeine  cough syrup with sedation precautions, rec ibuprofen  600mg  2-3 times daily with meals for next 5 days then PRN.  [facility] for azithromycin  printed with indications when to fill (fever >101, worsening productice cough, to cover bacterial or atypical bronchitis).       Relevant Medications   azithromycin  (ZITHROMAX ) 250 MG tablet     Meds ordered this encounter  Medications   azithromycin  (ZITHROMAX ) 250 MG tablet     Sig: Take 2 tablets on day 1, then 1 tablet daily on days 2 through 5    Dispense:  6 tablet    Refill:  0   guaiFENesin -codeine  100-10 MG/5ML syrup    Sig: Take 5 mLs by mouth 3 (three) times daily as needed.    Dispense:  120 mL    Refill:  0    No orders of the defined types were placed in this encounter.   Patient Instructions  Likely persistent  post infectious /post-viral cough from recent respiratory infection - this can take several weeks to go away.  Push fluids and rest.  Treat with codeine  cough syrup sent to pharmacy.  May also use ibuprofen  for lung inflammation - 600mg  three times daily with meals for next 5 days then as needed for inflammation.  Wait and see prescription printed out for azithromycin  antibiotic - fill if above not improving cough, or any new fevers >101 or worsening productive cough.  Let us  know if not better with above.   Follow up plan: No follow-ups on file.  Anton Blas, MD   "

## 2024-11-28 NOTE — Patient Instructions (Addendum)
 Likely persistent post infectious /post-viral cough from recent respiratory infection - this can take several weeks to go away.  Push fluids and rest.  Treat with codeine  cough syrup sent to pharmacy.  May also use ibuprofen  for lung inflammation - 600mg  three times daily with meals for next 5 days then as needed for inflammation.  Wait and see prescription printed out for azithromycin  antibiotic - fill if above not improving cough, or any new fevers >101 or worsening productive cough.  Let us  know if not better with above.
